# Patient Record
Sex: Female | Born: 1940
Health system: Southern US, Community
[De-identification: ages and names within clinical notes are randomized; demographics above are authoritative.]

## PROBLEM LIST (undated history)

## (undated) DIAGNOSIS — E039 Hypothyroidism, unspecified: Secondary | ICD-10-CM

## (undated) DIAGNOSIS — I10 Essential (primary) hypertension: Secondary | ICD-10-CM

## (undated) DIAGNOSIS — E785 Hyperlipidemia, unspecified: Secondary | ICD-10-CM

## (undated) DIAGNOSIS — Z8542 Personal history of malignant neoplasm of other parts of uterus: Secondary | ICD-10-CM

## (undated) DIAGNOSIS — M1990 Unspecified osteoarthritis, unspecified site: Secondary | ICD-10-CM

## (undated) DIAGNOSIS — B019 Varicella without complication: Secondary | ICD-10-CM

## (undated) DIAGNOSIS — M8580 Other specified disorders of bone density and structure, unspecified site: Secondary | ICD-10-CM

## (undated) HISTORY — DX: Personal history of malignant neoplasm of other parts of uterus: Z85.42

## (undated) HISTORY — PX: POLYPECTOMY: SHX149

## (undated) HISTORY — PX: HERNIA REPAIR: SHX51

## (undated) HISTORY — DX: Other specified disorders of bone density and structure, unspecified site: M85.80

## (undated) HISTORY — DX: Hypothyroidism, unspecified: E03.9

## (undated) HISTORY — DX: Varicella without complication: B01.9

## (undated) HISTORY — DX: Essential (primary) hypertension: I10

## (undated) HISTORY — PX: COLONOSCOPY: SHX174

## (undated) HISTORY — DX: Hyperlipidemia, unspecified: E78.5

## (undated) HISTORY — DX: Unspecified osteoarthritis, unspecified site: M19.90

## (undated) HISTORY — PX: FOOT SURGERY: SHX648

---

## 2000-10-18 HISTORY — PX: ABDOMINAL HYSTERECTOMY: SHX81

## 2016-11-25 DIAGNOSIS — I159 Secondary hypertension, unspecified: Secondary | ICD-10-CM | POA: Diagnosis not present

## 2016-11-25 DIAGNOSIS — E785 Hyperlipidemia, unspecified: Secondary | ICD-10-CM | POA: Diagnosis not present

## 2016-11-25 DIAGNOSIS — E039 Hypothyroidism, unspecified: Secondary | ICD-10-CM | POA: Diagnosis not present

## 2016-11-25 DIAGNOSIS — R5381 Other malaise: Secondary | ICD-10-CM | POA: Diagnosis not present

## 2016-11-25 DIAGNOSIS — R5383 Other fatigue: Secondary | ICD-10-CM | POA: Diagnosis not present

## 2017-02-11 DIAGNOSIS — I7389 Other specified peripheral vascular diseases: Secondary | ICD-10-CM | POA: Diagnosis not present

## 2017-02-11 DIAGNOSIS — B351 Tinea unguium: Secondary | ICD-10-CM | POA: Diagnosis not present

## 2017-02-11 DIAGNOSIS — L851 Acquired keratosis [keratoderma] palmaris et plantaris: Secondary | ICD-10-CM | POA: Diagnosis not present

## 2017-05-25 DIAGNOSIS — E039 Hypothyroidism, unspecified: Secondary | ICD-10-CM | POA: Diagnosis not present

## 2017-05-25 DIAGNOSIS — R7303 Prediabetes: Secondary | ICD-10-CM | POA: Diagnosis not present

## 2017-06-28 DIAGNOSIS — Z23 Encounter for immunization: Secondary | ICD-10-CM | POA: Diagnosis not present

## 2017-10-26 DIAGNOSIS — R600 Localized edema: Secondary | ICD-10-CM | POA: Diagnosis not present

## 2017-10-26 DIAGNOSIS — M79674 Pain in right toe(s): Secondary | ICD-10-CM | POA: Diagnosis not present

## 2017-10-26 DIAGNOSIS — I7091 Generalized atherosclerosis: Secondary | ICD-10-CM | POA: Diagnosis not present

## 2017-10-26 DIAGNOSIS — L84 Corns and callosities: Secondary | ICD-10-CM | POA: Diagnosis not present

## 2017-10-26 DIAGNOSIS — M79675 Pain in left toe(s): Secondary | ICD-10-CM | POA: Diagnosis not present

## 2017-10-26 DIAGNOSIS — B351 Tinea unguium: Secondary | ICD-10-CM | POA: Diagnosis not present

## 2017-11-28 DIAGNOSIS — R7303 Prediabetes: Secondary | ICD-10-CM | POA: Diagnosis not present

## 2017-11-28 DIAGNOSIS — G4700 Insomnia, unspecified: Secondary | ICD-10-CM | POA: Diagnosis not present

## 2017-11-28 DIAGNOSIS — E039 Hypothyroidism, unspecified: Secondary | ICD-10-CM | POA: Diagnosis not present

## 2017-11-28 DIAGNOSIS — E785 Hyperlipidemia, unspecified: Secondary | ICD-10-CM | POA: Diagnosis not present

## 2017-11-28 DIAGNOSIS — I10 Essential (primary) hypertension: Secondary | ICD-10-CM | POA: Diagnosis not present

## 2017-12-28 DIAGNOSIS — H43813 Vitreous degeneration, bilateral: Secondary | ICD-10-CM | POA: Diagnosis not present

## 2017-12-28 DIAGNOSIS — H2513 Age-related nuclear cataract, bilateral: Secondary | ICD-10-CM | POA: Diagnosis not present

## 2017-12-28 DIAGNOSIS — H524 Presbyopia: Secondary | ICD-10-CM | POA: Diagnosis not present

## 2017-12-28 DIAGNOSIS — I7091 Generalized atherosclerosis: Secondary | ICD-10-CM | POA: Diagnosis not present

## 2017-12-28 DIAGNOSIS — M79675 Pain in left toe(s): Secondary | ICD-10-CM | POA: Diagnosis not present

## 2017-12-28 DIAGNOSIS — B351 Tinea unguium: Secondary | ICD-10-CM | POA: Diagnosis not present

## 2017-12-28 DIAGNOSIS — M79674 Pain in right toe(s): Secondary | ICD-10-CM | POA: Diagnosis not present

## 2017-12-28 DIAGNOSIS — L84 Corns and callosities: Secondary | ICD-10-CM | POA: Diagnosis not present

## 2018-01-30 DIAGNOSIS — B9789 Other viral agents as the cause of diseases classified elsewhere: Secondary | ICD-10-CM | POA: Diagnosis not present

## 2018-01-30 DIAGNOSIS — J069 Acute upper respiratory infection, unspecified: Secondary | ICD-10-CM | POA: Diagnosis not present

## 2018-01-30 DIAGNOSIS — H1033 Unspecified acute conjunctivitis, bilateral: Secondary | ICD-10-CM | POA: Diagnosis not present

## 2018-02-14 DIAGNOSIS — L309 Dermatitis, unspecified: Secondary | ICD-10-CM | POA: Diagnosis not present

## 2018-02-14 DIAGNOSIS — L821 Other seborrheic keratosis: Secondary | ICD-10-CM | POA: Diagnosis not present

## 2018-02-14 DIAGNOSIS — D1800 Hemangioma unspecified site: Secondary | ICD-10-CM | POA: Diagnosis not present

## 2018-02-16 DIAGNOSIS — Q181 Preauricular sinus and cyst: Secondary | ICD-10-CM | POA: Diagnosis not present

## 2018-02-16 DIAGNOSIS — R300 Dysuria: Secondary | ICD-10-CM | POA: Diagnosis not present

## 2018-02-16 DIAGNOSIS — N907 Vulvar cyst: Secondary | ICD-10-CM | POA: Diagnosis not present

## 2018-02-23 DIAGNOSIS — N9089 Other specified noninflammatory disorders of vulva and perineum: Secondary | ICD-10-CM | POA: Diagnosis not present

## 2018-03-01 DIAGNOSIS — L84 Corns and callosities: Secondary | ICD-10-CM | POA: Diagnosis not present

## 2018-03-01 DIAGNOSIS — B351 Tinea unguium: Secondary | ICD-10-CM | POA: Diagnosis not present

## 2018-03-01 DIAGNOSIS — M79675 Pain in left toe(s): Secondary | ICD-10-CM | POA: Diagnosis not present

## 2018-03-01 DIAGNOSIS — M79674 Pain in right toe(s): Secondary | ICD-10-CM | POA: Diagnosis not present

## 2018-03-01 DIAGNOSIS — I7091 Generalized atherosclerosis: Secondary | ICD-10-CM | POA: Diagnosis not present

## 2018-03-22 DIAGNOSIS — I10 Essential (primary) hypertension: Secondary | ICD-10-CM | POA: Diagnosis not present

## 2018-03-22 DIAGNOSIS — E785 Hyperlipidemia, unspecified: Secondary | ICD-10-CM | POA: Diagnosis not present

## 2018-03-22 DIAGNOSIS — R5383 Other fatigue: Secondary | ICD-10-CM | POA: Diagnosis not present

## 2018-03-22 DIAGNOSIS — R7303 Prediabetes: Secondary | ICD-10-CM | POA: Diagnosis not present

## 2018-04-04 DIAGNOSIS — L720 Epidermal cyst: Secondary | ICD-10-CM | POA: Diagnosis not present

## 2018-04-26 DIAGNOSIS — B351 Tinea unguium: Secondary | ICD-10-CM | POA: Diagnosis not present

## 2018-04-26 DIAGNOSIS — L84 Corns and callosities: Secondary | ICD-10-CM | POA: Diagnosis not present

## 2018-04-26 DIAGNOSIS — M79674 Pain in right toe(s): Secondary | ICD-10-CM | POA: Diagnosis not present

## 2018-04-26 DIAGNOSIS — M79675 Pain in left toe(s): Secondary | ICD-10-CM | POA: Diagnosis not present

## 2018-04-26 DIAGNOSIS — I7091 Generalized atherosclerosis: Secondary | ICD-10-CM | POA: Diagnosis not present

## 2018-05-18 ENCOUNTER — Telehealth: Payer: Self-pay

## 2018-05-18 NOTE — Telephone Encounter (Signed)
Copied from [place] [id number]. Topic: Quick Communication - See Telephone Encounter >> May 18, 2018 12:31 PM [name] wrote: Pt called asking that Jacqueline Smith send to her a copy of the paperwork she was given today when the patient came in to see Dr. Nani Ravens today  Best number [phone]

## 2018-05-23 ENCOUNTER — Telehealth: Payer: Self-pay | Admitting: *Deleted

## 2018-05-23 NOTE — Telephone Encounter (Signed)
Received Medical records from [facility] [MRO], New patient 08/09/18; forwarded to provider/SLS 08/06

## 2018-06-05 ENCOUNTER — Telehealth: Payer: Self-pay | Admitting: Family Medicine

## 2018-06-05 ENCOUNTER — Other Ambulatory Visit: Payer: Self-pay | Admitting: Family Medicine

## 2018-06-05 DIAGNOSIS — Z1231 Encounter for screening mammogram for malignant neoplasm of breast: Secondary | ICD-10-CM

## 2018-06-05 NOTE — Telephone Encounter (Signed)
Spoke to patient regarding Mammogram. States she will call to schedule. Advised they will send order for provider to sign.

## 2018-06-05 NOTE — Telephone Encounter (Signed)
Copied from [place] [id number]. Topic: General - Other >> Jun 05, 2018  9:56 AM Judyann Munson wrote: Reason for CRM: Patient is requesting a call back in regards to some questions she has for scheduling a mammogram. Best contact number [phone]. Please advise

## 2018-06-20 DIAGNOSIS — Z23 Encounter for immunization: Secondary | ICD-10-CM | POA: Diagnosis not present

## 2018-06-23 DIAGNOSIS — L84 Corns and callosities: Secondary | ICD-10-CM | POA: Diagnosis not present

## 2018-06-23 DIAGNOSIS — B351 Tinea unguium: Secondary | ICD-10-CM | POA: Diagnosis not present

## 2018-06-23 DIAGNOSIS — M79671 Pain in right foot: Secondary | ICD-10-CM | POA: Diagnosis not present

## 2018-06-23 DIAGNOSIS — M79672 Pain in left foot: Secondary | ICD-10-CM | POA: Diagnosis not present

## 2018-07-05 ENCOUNTER — Ambulatory Visit: Payer: Self-pay

## 2018-07-05 ENCOUNTER — Ambulatory Visit
Admission: RE | Admit: 2018-07-05 | Discharge: 2018-07-05 | Disposition: A | Discharge status: Home / Self Care | Payer: Medicare Other | Source: Ambulatory Visit | Attending: Family Medicine | Admitting: Family Medicine

## 2018-07-05 DIAGNOSIS — Z1231 Encounter for screening mammogram for malignant neoplasm of breast: Secondary | ICD-10-CM

## 2018-08-09 ENCOUNTER — Ambulatory Visit: Payer: Self-pay | Admitting: Family Medicine

## 2018-08-11 ENCOUNTER — Ambulatory Visit: Payer: Self-pay | Admitting: Podiatry

## 2018-08-14 ENCOUNTER — Encounter: Payer: Self-pay | Admitting: Family Medicine

## 2018-08-14 ENCOUNTER — Ambulatory Visit (INDEPENDENT_AMBULATORY_CARE_PROVIDER_SITE_OTHER): Payer: Medicare Other | Admitting: Family Medicine

## 2018-08-14 VITALS — BP 109/60 | HR 95 | Temp 98.5°F | Resp 16 | Ht 65.0 in | Wt 180.0 lb

## 2018-08-14 DIAGNOSIS — M069 Rheumatoid arthritis, unspecified: Secondary | ICD-10-CM | POA: Insufficient documentation

## 2018-08-14 DIAGNOSIS — I10 Essential (primary) hypertension: Secondary | ICD-10-CM | POA: Insufficient documentation

## 2018-08-14 DIAGNOSIS — E785 Hyperlipidemia, unspecified: Secondary | ICD-10-CM | POA: Insufficient documentation

## 2018-08-14 DIAGNOSIS — F5102 Adjustment insomnia: Secondary | ICD-10-CM | POA: Diagnosis not present

## 2018-08-14 DIAGNOSIS — L989 Disorder of the skin and subcutaneous tissue, unspecified: Secondary | ICD-10-CM

## 2018-08-14 DIAGNOSIS — L570 Actinic keratosis: Secondary | ICD-10-CM

## 2018-08-14 DIAGNOSIS — E039 Hypothyroidism, unspecified: Secondary | ICD-10-CM | POA: Insufficient documentation

## 2018-08-14 DIAGNOSIS — R7303 Prediabetes: Secondary | ICD-10-CM

## 2018-08-14 DIAGNOSIS — D489 Neoplasm of uncertain behavior, unspecified: Secondary | ICD-10-CM

## 2018-08-14 DIAGNOSIS — G4700 Insomnia, unspecified: Secondary | ICD-10-CM | POA: Insufficient documentation

## 2018-08-14 MED ORDER — TEMAZEPAM 15 MG PO CAPS: ORAL_CAPSULE | ORAL | 3 refills | Status: DC

## 2018-08-14 NOTE — Progress Notes (Signed)
Chief Complaint  Patient presents with  . New Patient (Initial Visit)       New Patient Visit SUBJECTIVE: HPI: Jacqueline Smith is an 77 y.o.female who is being seen for establishing care.  The patient was previously seen at clinic in [place].  Hypertension Patient presents for hypertension follow up. She is compliant with medications- Hyzaar 100-25 mg/d. Patient has these side effects of medication: none She is adhering to a healthy diet overall.  Hypothyroidism Patient presents for follow-up of hypothyroidism.  Reports compliance with medication. Current symptoms include: denies fatigue, weight changes, heat/cold intolerance, bowel/skin changes or CVS symptoms She believes her dose should be unchanged  Skin lesion Area on nose that is scaly and has been bothering for last 12 mo or so. Will scale, sometimes itch, sometimes go away, but always come back. She is anxious about skin cancer.  Prediabetes Hx of prediabetes, last labs done in Feb of this year.  Insomnia Hx of insomnia 2/2 stress from her husband's health. He has CAD, CLL, Lymphoma, and frequent bouts of skin cancer. She will intermittently use Restoril. No AE's from it.   RA Hx of RA. Takes glucosamine that does help. Sometimes will take Tylenol that also helps. Does not want to see a rheumatologist at this time. jts affected area mainly knees, but now starting to affect hands. Not on any rx meds specifically for this.  Allergies  Allergen Reactions  . Demerol [Meperidine Hcl]   . Iodine   . Keflex [Cephalexin]   . Macrobid [Nitrofurantoin Macrocrystal]   . Mevacor [Lovastatin]   . Morphine And Related   . Sulfa Antibiotics   . Terramycin [Oxytetracycline]   . Vicodin [Hydrocodone-Acetaminophen]     Past Medical History:  Diagnosis Date  . Arthritis   . Chickenpox   . Hyperlipidemia   . Hypertension   . Hypothyroid    Past Surgical History:  Procedure Laterality Date  . ABDOMINAL HYSTERECTOMY  2002   . FOOT SURGERY Right    Family History  Problem Relation Age of Onset  . Arthritis Mother   . Hyperlipidemia Mother   . Heart attack Mother   . Hypertension Mother   . Arthritis Father   . Early death Father   . Hypertension Father   . Kidney disease Father   . Arthritis Sister   . Heart attack Maternal Grandmother   . Diabetes Maternal Grandfather   . Stroke Paternal Grandmother   . Arthritis Sister   . Breast cancer Neg Hx    Allergies  Allergen Reactions  . Demerol [Meperidine Hcl]   . Iodine   . Keflex [Cephalexin]   . Macrobid [Nitrofurantoin Macrocrystal]   . Mevacor [Lovastatin]   . Morphine And Related   . Sulfa Antibiotics   . Terramycin [Oxytetracycline]   . Vicodin [Hydrocodone-Acetaminophen]     Current Outpatient Medications:  .  cyanocobalamin 1000 MCG tablet, Take by mouth daily., Disp: , Rfl:  .  glucosamine-chondroitin 500-400 MG tablet, Take 1 tablet by mouth 3 (three) times daily., Disp: , Rfl:  .  levothyroxine (SYNTHROID, LEVOTHROID) 112 MCG tablet, Take 112 mcg by mouth daily before breakfast., Disp: , Rfl:  .  triamcinolone cream (KENALOG) 0.1 %, Apply 1 application topically 2 (two) times daily., Disp: , Rfl:  .  losartan-hydrochlorothiazide (HYZAAR) 100-25 MG tablet, Take 1 tablet by mouth daily., Disp: , Rfl: 0 .  pravastatin (PRAVACHOL) 10 MG tablet, TAKE 1 TABLET BY MOUTH EVERYDAY AT BEDTIME, Disp: , Rfl: 1 .  temazepam (RESTORIL) 15 MG capsule, TAKE 1 CAPSULE BY MOUTH AT BEDTIME AS NEEDED FOR SLEEP., Disp: 30 capsule, Rfl: 3  ROS 10 pt ROS neg unless otherwise noted in HPI  OBJECTIVE: BP 109/60 (BP Location: Left Arm, Patient Position: Sitting, Cuff Size: Small)   Pulse 95   Temp 98.5 F (36.9 C) (Oral)   Resp 16   Ht 5\' 5"  (1.651 m)   Wt 180 lb (81.6 kg)   SpO2 98%   BMI 29.95 kg/m   Constitutional: -  VS reviewed -  Well developed, well nourished, appears stated age -  No apparent distress  Psychiatric: -  Oriented to person,  place, and time -  Memory intact -  Affect and mood normal -  Fluent conversation, good eye contact -  Judgment and insight age appropriate  Eye: -  Conjunctivae clear, no discharge -  Pupils symmetric, round, reactive to light  ENMT: -  MMM    Pharynx moist, no exudate, no erythema  Neck: -  No gross swelling, no palpable masses -  Thyroid midline, not enlarged, mobile, no palpable masses  Cardiovascular: -  RRR -  No LE edema  Respiratory: -  Normal respiratory effort, no accessory muscle use, no retraction -  Breath sounds equal, no wheezes, no ronchi, no crackles  Neurological:  -  CN II - XII grossly intact -  Sensation grossly intact to light touch, equal bilaterally  Musculoskeletal: -  No clubbing, no cyanosis -  Gait normal  Skin: -  Scaly patch on bridge of nose measuring approx 0.8 cm in diameter. No erythema, fluctuance, ttp.  -  Warm and dry to palpation   Procedure note: cryotherapy Verbal consent obtained 1 skin lesion treated on nose Liquid nitrogen was applied via a thin spray creating an ice ball with 1-2 mm corona surrounding the lesion The patient tolerated the procedure well There were no immediate complications noted   ASSESSMENT/PLAN: Essential hypertension - Plan: losartan-hydrochlorothiazide (HYZAAR) 100-25 MG tablet, Comprehensive metabolic panel  Rheumatoid arthritis involving both knees, unspecified rheumatoid factor presence ([place]) - Plan: glucosamine-chondroitin 500-400 MG tablet  Neoplasm of uncertain behavior  Hyperlipidemia, unspecified hyperlipidemia type - Plan: pravastatin (PRAVACHOL) 10 MG tablet, Lipid panel  Prediabetes - Plan: Hemoglobin A1c  Hypothyroidism, unspecified type - Plan: levothyroxine (SYNTHROID, LEVOTHROID) 112 MCG tablet, TSH, T4, free  Adjustment insomnia - Plan: temazepam (RESTORIL) 15 MG capsule  Patient has already gotten [place] her records. OK to refill Restoril given intermittent use and situational use with poor  health of spouse. Cryotherapy for suspected actinic keratosis. Check labs. Continue medication. Patient should return in 6 mo or prn. Sooner for injections if needed. The patient voiced understanding and agreement to the plan.   [place], DO 08/14/18  4:25 PM

## 2018-08-14 NOTE — Patient Instructions (Addendum)
Let me know if you are ever interested in knee injections to help with pain.   Ice/cold pack over area for 10-15 min twice daily.  Give [place] 2-3 business days to get the results of your labs back.   Let us know if you need anything.

## 2018-08-15 ENCOUNTER — Telehealth: Payer: Self-pay | Admitting: Family Medicine

## 2018-08-15 DIAGNOSIS — L989 Disorder of the skin and subcutaneous tissue, unspecified: Secondary | ICD-10-CM

## 2018-08-15 LAB — COMPREHENSIVE METABOLIC PANEL
ALK PHOS: 45 U/L (ref 39–117)
ALT: 13 U/L (ref 0–35)
AST: 18 U/L (ref 0–37)
Albumin: 4.4 g/dL (ref 3.5–5.2)
BUN: 13 mg/dL (ref 6–23)
CALCIUM: 9.6 mg/dL (ref 8.4–10.5)
CO2: 28 meq/L (ref 19–32)
Chloride: 104 mEq/L (ref 96–112)
Creatinine, Ser: 0.63 mg/dL (ref 0.40–1.20)
GFR: 97.1 mL/min (ref >60.00)
GLUCOSE: 98 mg/dL (ref 70–99)
POTASSIUM: 4.2 meq/L (ref 3.5–5.1)
Sodium: 141 mEq/L (ref 135–145)
TOTAL PROTEIN: 6.8 g/dL (ref 6.0–8.3)
Total Bilirubin: 0.8 mg/dL (ref 0.2–1.2)

## 2018-08-15 LAB — TSH: TSH: 0.22 u[IU]/mL — ABNORMAL LOW (ref 0.35–4.50)

## 2018-08-15 LAB — LIPID PANEL
CHOLESTEROL: 178 mg/dL (ref 0–200)
HDL: 79.8 mg/dL (ref >39.00)
LDL Cholesterol: 85 mg/dL (ref 0–99)
NonHDL: 98.54
TRIGLYCERIDES: 69 mg/dL (ref 0.0–149.0)
Total CHOL/HDL Ratio: 2
VLDL: 13.8 mg/dL (ref 0.0–40.0)

## 2018-08-15 LAB — HEMOGLOBIN A1C: Hgb A1c MFr Bld: 5.5 % (ref 4.6–6.5)

## 2018-08-15 LAB — T4, FREE: Free T4: 1.26 ng/dL (ref 0.60–1.60)

## 2018-08-15 NOTE — Telephone Encounter (Signed)
Copied from [place] [id number]. Topic: Quick Communication - See Telephone Encounter >> Aug 15, 2018  5:17 PM Percell Belt A wrote: CRM for notification. See Telephone encounter for: 08/15/18.   Pt called in and has some concerns about the place on her nose,  she stated it is not scabbing over and would like to talk to nurse about it and also noticed that when she left there BP was 109/60 on the AVS  she was concerned that was to low for her.  Please advise   Best number [phone]

## 2018-08-16 NOTE — Telephone Encounter (Signed)
Have addressed issues with the patient/PCP this am

## 2018-08-17 NOTE — Telephone Encounter (Signed)
Patient called again stating no one has addressed the issues of her low BP.  Patient is on BP medication and she is afraid this is taking her BP down too low. Please advise. Patient would like to talk to the provider himself.

## 2018-08-18 ENCOUNTER — Telehealth: Payer: Self-pay

## 2018-08-18 NOTE — Telephone Encounter (Signed)
Called left message to call back 

## 2018-08-18 NOTE — Telephone Encounter (Signed)
OK to place referral. TY.

## 2018-08-18 NOTE — Telephone Encounter (Signed)
Also she would like a derm referral for her husband.

## 2018-08-18 NOTE — Telephone Encounter (Signed)
Pt. Called, upset that she did not have her recent labs mailed to her. "My old practice in [place] always mailed them out". Author confirmed that recents labs were normal, and that Robin, CMA, had discussed this over the phone with her already. Pt. Stated she would still like results mailed to her. Recent labs printed off, along with husband's recent lab results per pt. Request, and mailed to home address. Routed to [place] as [place].

## 2018-08-18 NOTE — Telephone Encounter (Signed)
BP is in normal range, the bottom number gets lower as we age due to changes. If concerned further, we can discuss in appt. TY.

## 2018-08-18 NOTE — Addendum Note (Signed)
Addended by: Sharon Seller B on: 08/18/2018 03:44 PM   Modules accepted: Orders

## 2018-08-18 NOTE — Telephone Encounter (Signed)
Patient is requesting a call back from nurse to find out if she should still take her BP medication because her BP have been running low in normal range. Please advise Ph# [phone]

## 2018-08-18 NOTE — Telephone Encounter (Signed)
Referral done

## 2018-08-18 NOTE — Telephone Encounter (Signed)
Ok thanks for the information.

## 2018-08-18 NOTE — Telephone Encounter (Signed)
Called the patient and number was busy

## 2018-08-18 NOTE — Telephone Encounter (Signed)
The patient called back and informed of PCP instructions. She verbalized understanding.  The patient would like a referral to [facility] dermatology (Dr. Alexis Goodell. Ronnald Ramp). This is for the spot on her nose.

## 2018-08-28 DIAGNOSIS — M79671 Pain in right foot: Secondary | ICD-10-CM | POA: Diagnosis not present

## 2018-08-28 DIAGNOSIS — M79672 Pain in left foot: Secondary | ICD-10-CM | POA: Diagnosis not present

## 2018-08-28 DIAGNOSIS — B351 Tinea unguium: Secondary | ICD-10-CM | POA: Diagnosis not present

## 2018-09-13 ENCOUNTER — Other Ambulatory Visit: Payer: Self-pay | Admitting: Family Medicine

## 2018-09-13 MED ORDER — TRIAMCINOLONE ACETONIDE 0.1 % EX CREA: 1.0000 "application " | TOPICAL_CREAM | Freq: Two times a day (BID) | CUTANEOUS | 0 refills | Status: DC

## 2018-09-13 NOTE — Telephone Encounter (Signed)
Copied from [place] [id number]. Topic: Quick Communication - Rx Refill/Question >> Sep 13, 2018 11:47 AM Bea Graff, NT wrote: Medication: triamcinolone cream (KENALOG) 0.1 %   Has the patient contacted their pharmacy? Yes.   (Agent: If no, request that the patient contact the pharmacy for the refill.) (Agent: If yes, when and what did the pharmacy advise?)  Preferred Pharmacy (with phone number or street name): [id number] [place], [address]. AT [address] [phone] (Phone) [phone] (Fax)    Agent: Please be advised that RX refills may take up to 3 business days. We ask that you follow-up with your pharmacy.

## 2018-09-13 NOTE — Telephone Encounter (Signed)
Requested medication (s) are due for refill today: yes  Requested medication (s) are on the active medication list: yes    Last refill: 08/14/18  Future visit scheduled no  Notes to clinic:historical provider  Requested Prescriptions  Pending Prescriptions Disp Refills   triamcinolone cream (KENALOG) 0.1 % 30 g     Sig: Apply 1 application topically 2 (two) times daily.     Dermatology:  Corticosteroids Passed - 09/13/2018 11:51 AM      Passed - Valid encounter within last 12 months    Recent Outpatient Visits          1 month ago Essential hypertension   [facility], [place], [place]

## 2018-09-22 ENCOUNTER — Other Ambulatory Visit: Payer: Self-pay | Admitting: Family Medicine

## 2018-09-22 DIAGNOSIS — L570 Actinic keratosis: Secondary | ICD-10-CM

## 2018-09-22 NOTE — Telephone Encounter (Signed)
Pt. Requesting refill of kenalog cream, last refilled 11/27. Routed to Dr. Nani Ravens to review and approve.

## 2018-10-30 DIAGNOSIS — M79671 Pain in right foot: Secondary | ICD-10-CM | POA: Diagnosis not present

## 2018-10-30 DIAGNOSIS — B351 Tinea unguium: Secondary | ICD-10-CM | POA: Diagnosis not present

## 2018-10-30 DIAGNOSIS — M79672 Pain in left foot: Secondary | ICD-10-CM | POA: Diagnosis not present

## 2018-11-09 ENCOUNTER — Encounter (INDEPENDENT_AMBULATORY_CARE_PROVIDER_SITE_OTHER): Payer: Self-pay

## 2018-11-09 ENCOUNTER — Ambulatory Visit (INDEPENDENT_AMBULATORY_CARE_PROVIDER_SITE_OTHER): Payer: Medicare Other | Admitting: Family Medicine

## 2018-11-09 ENCOUNTER — Encounter: Payer: Self-pay | Admitting: Family Medicine

## 2018-11-09 VITALS — BP 114/72 | HR 78 | Temp 97.8°F | Ht 65.0 in | Wt 184.4 lb

## 2018-11-09 DIAGNOSIS — L309 Dermatitis, unspecified: Secondary | ICD-10-CM

## 2018-11-09 DIAGNOSIS — M7121 Synovial cyst of popliteal space [Baker], right knee: Secondary | ICD-10-CM | POA: Diagnosis not present

## 2018-11-09 DIAGNOSIS — I83812 Varicose veins of left lower extremities with pain: Secondary | ICD-10-CM

## 2018-11-09 DIAGNOSIS — L989 Disorder of the skin and subcutaneous tissue, unspecified: Secondary | ICD-10-CM

## 2018-11-09 MED ORDER — SYNTHROID 125 MCG PO TABS: 125.0000 ug | ORAL_TABLET | Freq: Every day | ORAL | 3 refills | Status: DC

## 2018-11-09 NOTE — Progress Notes (Signed)
Pre visit review using our clinic review tool, if applicable. No additional management support is needed unless otherwise documented below in the visit note. 

## 2018-11-09 NOTE — Patient Instructions (Addendum)
If you do not hear anything about your referral in the next 1-2 weeks, call our office and ask for an update.  The skin lesion on your face looks OK. If anything changes, let me know.  Try Vaseline on your hands instead of the triamcinolone cream.   Let us know if you need anything.

## 2018-11-09 NOTE — Progress Notes (Signed)
Chief Complaint  Patient presents with  . Cyst    right leg behind knee    Subjective: Patient is a 78 y.o. female here for cyst behind R knee.  Starting to bother her. Has been there for several years.  She was told was a Baker's cyst in the past and because she was not having symptoms, nothing was done.  It has come back over the past couple months.  She has a skin lesion on her face.  She has an appointment with a dermatologist in April.  No significant change.  It does not hurt or itch.  Patient has a history of eczema on her hands.  She will use Kenalog in her hands afterwards.  She has never tried Vaseline before.  She has a history of varicose veins.  They only affect her left leg.  She is looking for a TED hose but has been unsuccessful over-the-counter.  She saw the vascular team in the past and they did a procedure she apparently did not tolerate.   ROS: MSK: As noted in HPI Cardio: No CP Lungs: No SOB  Past Medical History:  Diagnosis Date  . Arthritis   . Chickenpox   . Hyperlipidemia   . Hypertension   . Hypothyroid     Objective: BP 114/72 (BP Location: Left Arm, Patient Position: Sitting, Cuff Size: Normal)   Pulse 78   Temp 97.8 F (36.6 C) (Oral)   Ht 5\' 5"  (1.651 m)   Wt 184 lb 6 oz (83.6 kg)   SpO2 97%   BMI 30.68 kg/m  General: Awake, appears stated age HEENT: MMM, EOMi Heart: RRR, no bruits or LE edema Skin: +varicose veins on LLE Lungs: CTAB, no rales, wheezes or rhonchi. No accessory muscle use MSK: There is a soft bulge in popliteal area on R, no ttp or excessive warmth Psych: Age appropriate judgment and insight, normal affect and mood  Assessment and Plan: Synovial cyst of right popliteal space - Plan: Ambulatory referral to Orthopedic Surgery  Skin lesion  Eczema, unspecified type  Varicose veins of left lower extremity with pain  #1-Refer to Ortho #2-monitor, if it changes I will remove it prior to her dermatology  appointment #3-try Vaseline as it has fewer side effects #4- rx for ted hose given.  Will hold off on vascular surgery referral. Follow-up as originally scheduled in April where we will perform her EKG. The patient voiced understanding and agreement to the plan.  [place], DO 11/09/18  1:20 PM

## 2018-11-17 ENCOUNTER — Telehealth: Payer: Self-pay | Admitting: Family Medicine

## 2018-11-17 MED ORDER — SYNTHROID 112 MCG PO TABS: 112.0000 ug | ORAL_TABLET | Freq: Every day | ORAL | 3 refills | Status: DC

## 2018-11-17 NOTE — Telephone Encounter (Signed)
Advise please.

## 2018-11-17 NOTE — Telephone Encounter (Signed)
Patient is returning call because she missed the call. The best contact number [phone].

## 2018-11-17 NOTE — Telephone Encounter (Signed)
Printed. She can pick up or give [place] pharmacy info to fax. To my knowledge we have not received anything from a [place]. TY.

## 2018-11-17 NOTE — Telephone Encounter (Signed)
Copied from [place] [id number]. Topic: Quick Communication - Rx Refill/Question >> Nov 17, 2018 10:35 AM Reyne Dumas L wrote: Medication:  SYNTHROID 125 MCG tablet  Pt states that this script was written wrong.  States that she has always had 169mcg tablets, not 127mcg.  Pt states that she gets her medication from [place] and now there are lots of questions about what she needs. Pt states that she is running out of medication.  Pt can be reached at [phone]

## 2018-11-17 NOTE — Telephone Encounter (Signed)
Called her back and will mail the copy to her.

## 2018-11-17 NOTE — Telephone Encounter (Signed)
Actually did received request from [place]/have now faxed hardcopy to them and did receive confirmation. Will mail a hardcopy to the patient for her records. Left detailed message Fax number is [phone]

## 2018-11-17 NOTE — Telephone Encounter (Signed)
Called the patient and no answer//no voice mail to leave a message

## 2018-11-17 NOTE — Telephone Encounter (Signed)
Pt called back because she states the synthroid Rx should be  SYNTHROID 112 MCG tablet. Pt states she gets this rx from [place]. She could not give me the name of the pharmacy. I asked her how should we get the Rx to the pharmacy, as [facility]. She could not understand what I was asking. She states she had a hard copy that she faxed last time and did not look at the rx.  Pt states the pharmacy is supposed to contact our office. The [place] pharmacy is not on her list. Pt hung up.

## 2018-11-29 ENCOUNTER — Ambulatory Visit (INDEPENDENT_AMBULATORY_CARE_PROVIDER_SITE_OTHER): Payer: Medicare Other

## 2018-11-29 ENCOUNTER — Ambulatory Visit (INDEPENDENT_AMBULATORY_CARE_PROVIDER_SITE_OTHER): Payer: Medicare Other | Admitting: Orthopedic Surgery

## 2018-11-29 ENCOUNTER — Encounter (INDEPENDENT_AMBULATORY_CARE_PROVIDER_SITE_OTHER): Payer: Self-pay | Admitting: Orthopedic Surgery

## 2018-11-29 DIAGNOSIS — M25561 Pain in right knee: Secondary | ICD-10-CM

## 2018-11-29 DIAGNOSIS — G8929 Other chronic pain: Secondary | ICD-10-CM

## 2018-12-02 ENCOUNTER — Encounter (INDEPENDENT_AMBULATORY_CARE_PROVIDER_SITE_OTHER): Payer: Self-pay | Admitting: Orthopedic Surgery

## 2018-12-02 NOTE — Progress Notes (Signed)
Office Visit Note   Patient: Jacqueline Smith           Date of Birth: 1941-09-12           MRN: [id number] Visit Date: 11/29/2018 Requested by: Shelda Pal, [address] [place], [place] [address] PCP: Shelda Pal, DO  Subjective: Chief Complaint  Patient presents with  . Right Knee - Pain    HPI: Cayleigh is a patient with right knee pain.  Normally she has no pain but she has been having some fullness in the posterior aspect of the knee and recently.  Denies any history of injury.  Denies any mechanical symptoms in the knee.  Occasionally takes some over-the-counter medication for the problem.              ROS: All systems reviewed are negative as they relate to the chief complaint within the history of present illness.  Patient denies  fevers or chills.   Assessment & Plan: Visit Diagnoses:  1. Chronic pain of right knee     Plan: Impression is right knee arthritis with sizable Baker's cyst in the posterior aspect of the knee.  Patient would like to have that cyst decompressed today.  She does not have a driver and I think it would be best if she comes back next week with a driver so I can numb up the posterior aspect of that knee and aspirate that cyst under ultrasound guidance.  It is her right leg.  I will see her back next week and we may also inject the knee at that time for some arthritis pain relief as well.  Follow-Up Instructions: No follow-ups on file.   Orders:  Orders Placed This Encounter  Procedures  . XR Knee 1-2 Views Right   No orders of the defined types were placed in this encounter.     Procedures: No procedures performed   Clinical Data: No additional findings.  Objective: Vital Signs: There were no vitals taken for this visit.  Physical Exam:   Constitutional: Patient appears well-developed HEENT:  Head: Normocephalic Eyes:EOM are normal Neck: Normal range of motion Cardiovascular: Normal  rate Pulmonary/chest: Effort normal Neurologic: Patient is alert Skin: Skin is warm Psychiatric: Patient has normal mood and affect    Ortho Exam: Orthopedic exam demonstrates normal gait alignment with palpable pedal pulses.  No groin pain with internal X rotation of either leg.  Trace effusion present in the right knee no effusion in the left knee.  Baker's cyst is palpable in the posterior aspect of the right knee.  Ultrasound confirms cystic nature of the Baker's cyst.  No solid component no pulsatile component to this cyst.  Range of motion easily past 90 with stable collateral cruciate ligaments  Specialty Comments:  No specialty comments available.  Imaging: No results found.   PMFS History: Patient Active Problem List   Diagnosis Date Noted  . Skin lesion 11/09/2018  . Eczema 11/09/2018  . Essential hypertension 08/14/2018  . Rheumatoid arthritis involving both knees ([place]) 08/14/2018  . Hyperlipidemia 08/14/2018  . Prediabetes 08/14/2018  . Hypothyroidism 08/14/2018  . Adjustment insomnia 08/14/2018   Past Medical History:  Diagnosis Date  . Arthritis   . Chickenpox   . Hyperlipidemia   . Hypertension   . Hypothyroid     Family History  Problem Relation Age of Onset  . Arthritis Mother   . Hyperlipidemia Mother   . Heart attack Mother   .  Hypertension Mother   . Arthritis Father   . Early death Father   . Hypertension Father   . Kidney disease Father   . Arthritis Sister   . Heart attack Maternal Grandmother   . Diabetes Maternal Grandfather   . Stroke Paternal Grandmother   . Arthritis Sister   . Breast cancer Neg Hx     Past Surgical History:  Procedure Laterality Date  . ABDOMINAL HYSTERECTOMY  2002  . FOOT SURGERY Right    Social History   Occupational History  . Not on file  Tobacco Use  . Smoking status: Former Smoker    Last attempt to quit: 11/29/2013    Years since quitting: 5.0  . Smokeless tobacco: Never Used  Substance and Sexual  Activity  . Alcohol use: Not on file  . Drug use: Not on file  . Sexual activity: Not on file

## 2018-12-06 ENCOUNTER — Ambulatory Visit (INDEPENDENT_AMBULATORY_CARE_PROVIDER_SITE_OTHER): Payer: Medicare Other | Admitting: Orthopedic Surgery

## 2018-12-06 ENCOUNTER — Encounter (INDEPENDENT_AMBULATORY_CARE_PROVIDER_SITE_OTHER): Payer: Self-pay | Admitting: Orthopedic Surgery

## 2018-12-06 DIAGNOSIS — M1711 Unilateral primary osteoarthritis, right knee: Secondary | ICD-10-CM

## 2018-12-09 ENCOUNTER — Encounter (INDEPENDENT_AMBULATORY_CARE_PROVIDER_SITE_OTHER): Payer: Self-pay | Admitting: Orthopedic Surgery

## 2018-12-09 DIAGNOSIS — M1711 Unilateral primary osteoarthritis, right knee: Secondary | ICD-10-CM | POA: Diagnosis not present

## 2018-12-09 MED ORDER — LIDOCAINE HCL 1 % IJ SOLN
5.0000 mL | INTRAMUSCULAR | Status: AC | PRN
  Administered 2018-12-09: 5 mL

## 2018-12-09 MED ORDER — BUPIVACAINE HCL 0.25 % IJ SOLN
4.0000 mL | INTRAMUSCULAR | Status: AC | PRN
  Administered 2018-12-09: 4 mL via INTRA_ARTICULAR

## 2018-12-09 MED ORDER — METHYLPREDNISOLONE ACETATE 40 MG/ML IJ SUSP
40.0000 mg | INTRAMUSCULAR | Status: AC | PRN
  Administered 2018-12-09: 40 mg via INTRA_ARTICULAR

## 2018-12-09 NOTE — Progress Notes (Signed)
   Procedure Note  Patient: Jacqueline Smith             Date of Birth: [birth year]           MRN: [id number]             Visit Date: 12/06/2018  Procedures: Visit Diagnoses: Unilateral primary osteoarthritis, right knee  Large Joint Inj: R knee on 12/09/2018 9:38 AM Indications: diagnostic evaluation, joint swelling and pain Details: 18 G 1.5 in needle, superolateral approach  Arthrogram: No  Medications: 5 mL lidocaine 1 %; 40 mg methylPREDNISolone acetate 40 MG/ML; 4 mL bupivacaine 0.25 % Outcome: tolerated well, no immediate complications Procedure, treatment alternatives, risks and benefits explained, specific risks discussed. Consent was given by the patient. Immediately prior to procedure a time out was called to verify the correct patient, procedure, equipment, support staff and site/side marked as required. Patient was prepped and draped in the usual sterile fashion.     Right knee popliteal cyst also aspirated and injected with 1/2 cc of Depo-Medrol.  We did obtain about 30 cc of fluid from that popliteal cyst which was aspirated under ultrasound guidance.  This was a planned injection.

## 2018-12-22 ENCOUNTER — Telehealth: Payer: Self-pay | Admitting: Family Medicine

## 2018-12-22 DIAGNOSIS — E785 Hyperlipidemia, unspecified: Secondary | ICD-10-CM

## 2018-12-22 MED ORDER — PRAVASTATIN SODIUM 10 MG PO TABS: ORAL_TABLET | ORAL | 3 refills | Status: DC

## 2018-12-22 NOTE — Telephone Encounter (Signed)
Sent in refill and patient informed

## 2018-12-22 NOTE — Telephone Encounter (Signed)
Copied from [place] [id number]. Topic: Quick Communication - Rx Refill/Question >> Dec 22, 2018 10:27 AM Blase Mess A wrote: Medication: pravastatin (PRAVACHOL) 10 MG tablet [[id number]]   Has the patient contacted their pharmacy? Yes  (Agent: If no, request that the patient contact the pharmacy for the refill.) (Agent: If yes, when and what did the pharmacy advise?)  Preferred Pharmacy (with phone number [facility] street name[id number] [place], [place]. AT [address] [phone] (Phone) [phone] (Fax)    Agent: Please be advised that RX refills may take up to 3 business days. We ask that you follow-up with your pharmacy.

## 2018-12-25 ENCOUNTER — Other Ambulatory Visit: Payer: Self-pay | Admitting: Family Medicine

## 2019-01-09 DIAGNOSIS — M79671 Pain in right foot: Secondary | ICD-10-CM | POA: Diagnosis not present

## 2019-01-09 DIAGNOSIS — M79672 Pain in left foot: Secondary | ICD-10-CM | POA: Diagnosis not present

## 2019-01-09 DIAGNOSIS — B351 Tinea unguium: Secondary | ICD-10-CM | POA: Diagnosis not present

## 2019-02-07 ENCOUNTER — Ambulatory Visit: Payer: Medicare Other | Admitting: Family Medicine

## 2019-02-15 ENCOUNTER — Ambulatory Visit: Payer: Medicare Other | Admitting: Family Medicine

## 2019-03-15 ENCOUNTER — Other Ambulatory Visit: Payer: Self-pay | Admitting: Family Medicine

## 2019-03-15 DIAGNOSIS — Z1231 Encounter for screening mammogram for malignant neoplasm of breast: Secondary | ICD-10-CM

## 2019-03-20 DIAGNOSIS — M79671 Pain in right foot: Secondary | ICD-10-CM | POA: Diagnosis not present

## 2019-03-20 DIAGNOSIS — B351 Tinea unguium: Secondary | ICD-10-CM | POA: Diagnosis not present

## 2019-03-20 DIAGNOSIS — M79672 Pain in left foot: Secondary | ICD-10-CM | POA: Diagnosis not present

## 2019-03-28 ENCOUNTER — Encounter: Payer: Self-pay | Admitting: Family Medicine

## 2019-03-28 ENCOUNTER — Ambulatory Visit (INDEPENDENT_AMBULATORY_CARE_PROVIDER_SITE_OTHER): Payer: Medicare Other | Admitting: Family Medicine

## 2019-03-28 ENCOUNTER — Other Ambulatory Visit: Payer: Self-pay

## 2019-03-28 VITALS — BP 132/78 | HR 69 | Temp 98.1°F | Ht 64.0 in | Wt 185.0 lb

## 2019-03-28 DIAGNOSIS — E039 Hypothyroidism, unspecified: Secondary | ICD-10-CM | POA: Diagnosis not present

## 2019-03-28 DIAGNOSIS — E785 Hyperlipidemia, unspecified: Secondary | ICD-10-CM

## 2019-03-28 DIAGNOSIS — E2839 Other primary ovarian failure: Secondary | ICD-10-CM | POA: Diagnosis not present

## 2019-03-28 DIAGNOSIS — R7303 Prediabetes: Secondary | ICD-10-CM | POA: Diagnosis not present

## 2019-03-28 DIAGNOSIS — I10 Essential (primary) hypertension: Secondary | ICD-10-CM

## 2019-03-28 LAB — COMPREHENSIVE METABOLIC PANEL
ALT: 10 U/L (ref 0–35)
AST: 15 U/L (ref 0–37)
Albumin: 4 g/dL (ref 3.5–5.2)
Alkaline Phosphatase: 42 U/L (ref 39–117)
BUN: 16 mg/dL (ref 6–23)
CO2: 29 mEq/L (ref 19–32)
Calcium: 9.4 mg/dL (ref 8.4–10.5)
Chloride: 105 mEq/L (ref 96–112)
Creatinine, Ser: 0.61 mg/dL (ref 0.40–1.20)
GFR: 94.68 mL/min (ref >60.00)
Glucose, Bld: 94 mg/dL (ref 70–99)
Potassium: 4.1 mEq/L (ref 3.5–5.1)
Sodium: 141 mEq/L (ref 135–145)
Total Bilirubin: 0.7 mg/dL (ref 0.2–1.2)
Total Protein: 6.2 g/dL (ref 6.0–8.3)

## 2019-03-28 LAB — CBC
HCT: 39.8 % (ref 36.0–46.0)
Hemoglobin: 13.2 g/dL (ref 12.0–15.0)
MCHC: 33.1 g/dL (ref 30.0–36.0)
MCV: 99.4 fl (ref 78.0–100.0)
Platelets: 218 10*3/uL (ref 150.0–400.0)
RBC: 4 Mil/uL (ref 3.87–5.11)
RDW: 13.6 % (ref 11.5–15.5)
WBC: 6.4 10*3/uL (ref 4.0–10.5)

## 2019-03-28 LAB — LIPID PANEL
Cholesterol: 179 mg/dL (ref 0–200)
HDL: 74 mg/dL (ref >39.00)
LDL Cholesterol: 87 mg/dL (ref 0–99)
NonHDL: 104.65
Total CHOL/HDL Ratio: 2
Triglycerides: 90 mg/dL (ref 0.0–149.0)
VLDL: 18 mg/dL (ref 0.0–40.0)

## 2019-03-28 LAB — HEMOGLOBIN A1C: Hgb A1c MFr Bld: 5.6 % (ref 4.6–6.5)

## 2019-03-28 LAB — TSH: TSH: 0.44 u[IU]/mL (ref 0.35–4.50)

## 2019-03-28 LAB — T4, FREE: Free T4: 1.18 ng/dL (ref 0.60–1.60)

## 2019-03-28 NOTE — Progress Notes (Signed)
Chief Complaint  Patient presents with  . Follow-up    Subjective Jacqueline Smith is a 78 y.o. female who presents for hypertension follow up. She does not monitor home blood pressures. She is compliant with medications. Patient has these side effects of medication: none She is adhering to a healthy diet overall. Current exercise: walking  Hypothyroidism Patient presents for follow-up of hypothyroidism.  Reports compliance with medication. Current symptoms include: denies fatigue, weight changes, heat/cold intolerance, bowel/skin changes or CVS symptoms She believes her dose should be unchanged  Hyperlipidemia Patient presents for dyslipidemia follow up. Currently being treated with pravastatin 10 mg/d and compliance with treatment thus far has been good. She denies myalgias. She is adhering to a healthy diet. Exercise: walking The patient is not known to have coexisting coronary artery disease.  Past Medical History:  Diagnosis Date  . Arthritis   . Chickenpox   . Hyperlipidemia   . Hypertension   . Hypothyroid     Review of Systems Cardiovascular: no chest pain Respiratory:  no shortness of breath  Exam BP 132/78 (BP Location: Left Arm, Patient Position: Sitting, Cuff Size: Normal)   Pulse 69   Temp 98.1 F (36.7 C) (Oral)   Ht 5\' 4"  (1.626 m)   Wt 185 lb (83.9 kg)   SpO2 97%   BMI 31.76 kg/m  General:  well developed, well nourished, in no apparent distress Heart: RRR, no bruits, no LE edema Lungs: clear to auscultation, no accessory muscle use Psych: well oriented with normal range of affect and appropriate judgment/insight  Essential hypertension - Plan: Comprehensive metabolic panel, EKG [id number]  Prediabetes - Plan: Hemoglobin A1c, CBC  Hyperlipidemia, unspecified hyperlipidemia type - Plan: Lipid panel  Hypothyroidism, unspecified type - Plan: TSH, T4, free, CBC  Estrogen deficiency - Plan: DG Bone Density  Orders as above. EKG neg.   Counseled on diet and exercise. We are out of Tdaps. F/u in 6 mo. The patient voiced understanding and agreement to the plan.  [place], DO 03/28/19  10:55 AM

## 2019-03-28 NOTE — Patient Instructions (Addendum)
Give [place] 2-3 business days to get the results of your labs back.   Keep the diet clean and stay active.  Contact your orthopedic specialist for your knee.   Let us know if you need anything.

## 2019-04-05 ENCOUNTER — Encounter: Payer: Self-pay | Admitting: Orthopedic Surgery

## 2019-04-05 ENCOUNTER — Ambulatory Visit (INDEPENDENT_AMBULATORY_CARE_PROVIDER_SITE_OTHER): Payer: Medicare Other | Admitting: Orthopedic Surgery

## 2019-04-05 ENCOUNTER — Other Ambulatory Visit: Payer: Self-pay

## 2019-04-05 DIAGNOSIS — M1711 Unilateral primary osteoarthritis, right knee: Secondary | ICD-10-CM

## 2019-04-05 DIAGNOSIS — M1712 Unilateral primary osteoarthritis, left knee: Secondary | ICD-10-CM

## 2019-04-06 ENCOUNTER — Encounter: Payer: Self-pay | Admitting: Orthopedic Surgery

## 2019-04-06 DIAGNOSIS — M1711 Unilateral primary osteoarthritis, right knee: Secondary | ICD-10-CM

## 2019-04-06 MED ORDER — BUPIVACAINE HCL 0.25 % IJ SOLN
4.0000 mL | INTRAMUSCULAR | Status: AC | PRN
  Administered 2019-04-06: 4 mL via INTRA_ARTICULAR

## 2019-04-06 MED ORDER — LIDOCAINE HCL 1 % IJ SOLN
5.0000 mL | INTRAMUSCULAR | Status: AC | PRN
  Administered 2019-04-06: 5 mL

## 2019-04-06 MED ORDER — METHYLPREDNISOLONE ACETATE 40 MG/ML IJ SUSP
40.0000 mg | INTRAMUSCULAR | Status: AC | PRN
  Administered 2019-04-06: 40 mg via INTRA_ARTICULAR

## 2019-04-06 NOTE — Progress Notes (Signed)
Office Visit Note   Patient: Jacqueline Smith           Date of Birth: 01-26-1941           MRN: [id number] Visit Date: 04/05/2019 Requested by: Shelda Pal, [place],  [place] [address] PCP: Shelda Pal, DO  Subjective: Chief Complaint  Patient presents with  . Right Knee - Pain    HPI: Persephanie is a patient with right knee pain.  She actually has bilateral knee pain and arthritis as well as varicosities on the left.  She like to get referred for varicose vein treatment and I am going to do that with Dr. Andria Rhein per her request.  She has looked him up and feels like he has a lot of experience and that is what she is looking for.  She had knee injection on the right-hand side in February and that gave her good relief.  She wants to continue to be active.  Denies any interval history of injury or trauma..              ROS: All systems reviewed are negative as they relate to the chief complaint within the history of present illness.  Patient denies  fevers or chills.   Assessment & Plan: Visit Diagnoses:  1. Unilateral primary osteoarthritis, right knee     Plan: Impression is right knee pain with arthritis and left knee pain with less severe arthritis.  Patient requesting injections into both knees which is done today.  Referral for left leg varicosities given.  I will see her back in about 3 to 6 months if she wants to proceed with further injection treatment either cortisone or gel injections.  Follow-Up Instructions: Return if symptoms worsen or fail to improve.   Orders:  No orders of the defined types were placed in this encounter.  No orders of the defined types were placed in this encounter.     Procedures: Large Joint Inj: bilateral knee on 04/06/2019 10:32 PM Indications: diagnostic evaluation, joint swelling and pain Details: 18 G 1.5 in needle, superolateral approach  Arthrogram: No  Medications (Right):  5 mL lidocaine 1 %; 4 mL bupivacaine 0.25 %; 40 mg methylPREDNISolone acetate 40 MG/ML Medications (Left): 5 mL lidocaine 1 %; 4 mL bupivacaine 0.25 %; 40 mg methylPREDNISolone acetate 40 MG/ML Outcome: tolerated well, no immediate complications Procedure, treatment alternatives, risks and benefits explained, specific risks discussed. Consent was given by the patient. Immediately prior to procedure a time out was called to verify the correct patient, procedure, equipment, support staff and site/side marked as required. Patient was prepped and draped in the usual sterile fashion.       Clinical Data: No additional findings.  Objective: Vital Signs: There were no vitals taken for this visit.  Physical Exam:   Constitutional: Patient appears well-developed HEENT:  Head: Normocephalic Eyes:EOM are normal Neck: Normal range of motion Cardiovascular: Normal rate Pulmonary/chest: Effort normal Neurologic: Patient is alert Skin: Skin is warm Psychiatric: Patient has normal mood and affect    Ortho Exam: Ortho exam demonstrates full active and passive range of motion of both knees with no real effusion in either knee.  Pedal pulses palpable.  She does have varicosities more prominent on the left than the right but negative Homans sign.  Pedal pulses palpable.  No groin pain with internal X rotation of the leg.  Collateral and cruciate ligaments are stable.  Specialty Comments:  No specialty comments available.  Imaging: No results found.   PMFS History: Patient Active Problem List   Diagnosis Date Noted  . Skin lesion 11/09/2018  . Eczema 11/09/2018  . Essential hypertension 08/14/2018  . Hyperlipidemia 08/14/2018  . Prediabetes 08/14/2018  . Hypothyroidism 08/14/2018  . Adjustment insomnia 08/14/2018   Past Medical History:  Diagnosis Date  . Arthritis   . Chickenpox   . Hyperlipidemia   . Hypertension   . Hypothyroid     Family History  Problem Relation Age of  Onset  . Arthritis Mother   . Hyperlipidemia Mother   . Heart attack Mother   . Hypertension Mother   . Arthritis Father   . Hypertension Father   . Kidney disease Father   . Arthritis Sister   . Heart attack Maternal Grandmother   . Diabetes Maternal Grandfather   . Stroke Paternal Grandmother   . Arthritis Sister   . Breast cancer Neg Hx     Past Surgical History:  Procedure Laterality Date  . ABDOMINAL HYSTERECTOMY  2002  . FOOT SURGERY Right    Social History   Occupational History  . Not on file  Tobacco Use  . Smoking status: Former Smoker    Quit date: 11/29/2013    Years since quitting: 5.3  . Smokeless tobacco: Never Used  Substance and Sexual Activity  . Alcohol use: Not on file  . Drug use: Not on file  . Sexual activity: Not on file

## 2019-04-09 ENCOUNTER — Encounter: Payer: Self-pay | Admitting: Family Medicine

## 2019-04-10 ENCOUNTER — Telehealth: Payer: Self-pay | Admitting: Orthopedic Surgery

## 2019-04-10 DIAGNOSIS — M79604 Pain in right leg: Secondary | ICD-10-CM

## 2019-04-10 NOTE — Telephone Encounter (Signed)
Ok for this? 

## 2019-04-10 NOTE — Telephone Encounter (Signed)
Y pls refer to Jacqueline Smith or Jacqueline Smith

## 2019-04-10 NOTE — Telephone Encounter (Signed)
Pt lmom requesting a referral to [facility].

## 2019-04-10 NOTE — Telephone Encounter (Signed)
Put in order Tried calling patient to advise. No answer. No VM

## 2019-05-08 ENCOUNTER — Other Ambulatory Visit: Payer: Self-pay

## 2019-05-08 DIAGNOSIS — M79606 Pain in leg, unspecified: Secondary | ICD-10-CM

## 2019-05-14 ENCOUNTER — Telehealth (HOSPITAL_COMMUNITY): Payer: Self-pay

## 2019-05-14 NOTE — Telephone Encounter (Signed)
The above patient or their representative was contacted and gave the following answers to these questions:         Do you have any of the following symptoms? No  Fever                    Cough                   Shortness of breath  Do  you have any of the following other symptoms?  No   muscle pain         vomiting,        diarrhea        rash         weakness        red eye        abdominal pain         bruising          bruising or bleeding              joint pain           severe headache    Have you been in contact with someone who was or has been sick in the past 2 weeks? No  Yes                 Unsure                         Unable to assess   Does the person that you were in contact with have any of the following symptoms? N/A   Cough         shortness of breath           muscle pain         vomiting,            diarrhea            rash            weakness           fever            red eye           abdominal pain           bruising  or  bleeding                joint pain                severe headache                COMMENTS OR ACTION PLAN FOR THIS PATIENT:         

## 2019-05-15 ENCOUNTER — Other Ambulatory Visit: Payer: Self-pay | Admitting: *Deleted

## 2019-05-15 ENCOUNTER — Ambulatory Visit (HOSPITAL_COMMUNITY)
Admission: RE | Admit: 2019-05-15 | Discharge: 2019-05-15 | Disposition: A | Discharge status: Home / Self Care | Payer: Medicare Other | Source: Ambulatory Visit | Attending: Vascular Surgery | Admitting: Vascular Surgery

## 2019-05-15 ENCOUNTER — Other Ambulatory Visit: Payer: Self-pay

## 2019-05-15 ENCOUNTER — Encounter: Payer: Self-pay | Admitting: Vascular Surgery

## 2019-05-15 ENCOUNTER — Ambulatory Visit (INDEPENDENT_AMBULATORY_CARE_PROVIDER_SITE_OTHER): Payer: Medicare Other | Admitting: Vascular Surgery

## 2019-05-15 VITALS — BP 127/66 | HR 69 | Temp 97.6°F | Resp 20 | Ht 64.0 in | Wt 185.0 lb

## 2019-05-15 DIAGNOSIS — I8312 Varicose veins of left lower extremity with inflammation: Secondary | ICD-10-CM | POA: Diagnosis not present

## 2019-05-15 DIAGNOSIS — I83812 Varicose veins of left lower extremities with pain: Secondary | ICD-10-CM

## 2019-05-15 DIAGNOSIS — M79606 Pain in leg, unspecified: Secondary | ICD-10-CM

## 2019-05-15 NOTE — Progress Notes (Signed)
Vascular and Vein Specialist of   Patient name: Jacqueline Smith MRN: [id number] DOB: 08/09/1941 Sex: female  REASON FOR CONSULT: Evaluation lower extremity pain  HPI: Jacqueline Smith is a 78 y.o. female, who is here today for evaluation of lower extremity pain.  She reports progressive varicosities in her left medial thigh.  These began just above her knee and then extend down through her medial calf and pretibial area.  These are tender to touch especially after standing for any prolonged period of time.  She does have mild bilateral lower extremity swelling.  No history of DVT and no history of venous ulceration  Past Medical History:  Diagnosis Date  . Arthritis   . Chickenpox   . Hyperlipidemia   . Hypertension   . Hypothyroid     Family History  Problem Relation Age of Onset  . Arthritis Mother   . Hyperlipidemia Mother   . Heart attack Mother   . Hypertension Mother   . Arthritis Father   . Hypertension Father   . Kidney disease Father   . Arthritis Sister   . Heart attack Maternal Grandmother   . Diabetes Maternal Grandfather   . Stroke Paternal Grandmother   . Arthritis Sister   . Breast cancer Neg Hx     SOCIAL HISTORY: Social History   Socioeconomic History  . Marital status: Married    Spouse name: Not on file  . Number of children: Not on file  . Years of education: Not on file  . Highest education level: Not on file  Occupational History  . Not on file  Social Needs  . Financial resource strain: Not on file  . Food insecurity    Worry: Not on file    Inability: Not on file  . Transportation needs    Medical: Not on file    Non-medical: Not on file  Tobacco Use  . Smoking status: Former Smoker    Quit date: 11/29/2013    Years since quitting: 5.4  . Smokeless tobacco: Never Used  Substance and Sexual Activity  . Alcohol use: Yes    Alcohol/week: 20.0 standard drinks    Types: 20 Glasses of  wine per week  . Drug use: Never  . Sexual activity: Not on file  Lifestyle  . Physical activity    Days per week: 4 days    Minutes per session: 60 min  . Stress: Not on file  Relationships  . Social Herbalist on phone: Once a week    Gets together: Never    Attends religious service: More than 4 times per year    Active member of club or organization: No    Attends meetings of clubs or organizations: Never    Relationship status: Married  . Intimate partner violence    Fear of current or ex partner: No    Emotionally abused: No    Physically abused: No    Forced sexual activity: No  Other Topics Concern  . Not on file  Social History Narrative  . Not on file    Allergies  Allergen Reactions  . Demerol [Meperidine Hcl]   . Iodine   . Keflex [Cephalexin]   . Macrobid [Nitrofurantoin Macrocrystal]   . Mevacor [Lovastatin]   . Morphine And Related   . Sulfa Antibiotics   . Terramycin [Oxytetracycline]   . Vicodin [Hydrocodone-Acetaminophen]     Current Outpatient Medications  Medication Sig Dispense Refill  . acetaminophen (  TYLENOL) 500 MG tablet Take 500 mg by mouth every 6 (six) hours as needed.    . cyanocobalamin 1000 MCG tablet Take by mouth daily.    Marland Kitchen glucosamine-chondroitin 500-400 MG tablet Take 1 tablet by mouth 3 (three) times daily.    Marland Kitchen losartan-hydrochlorothiazide (HYZAAR) 100-25 MG tablet TAKE 1 TABLET BY MOUTH EVERY DAY 90 tablet 3  . pravastatin (PRAVACHOL) 10 MG tablet TAKE 1 TABLET BY MOUTH EVERYDAY AT BEDTIME 90 tablet 3  . SYNTHROID 112 MCG tablet Take 1 tablet (112 mcg total) by mouth daily before breakfast. 90 tablet 3  . temazepam (RESTORIL) 15 MG capsule TAKE 1 CAPSULE BY MOUTH AT BEDTIME AS NEEDED FOR SLEEP. 30 capsule 3  . triamcinolone cream (KENALOG) 0.1 % APPLY TO AFFECTED AREA TWICE A DAY 30 g 0   No current facility-administered medications for this visit.     REVIEW OF SYSTEMS:  [X]  denotes positive finding, [ ]  denotes  negative finding Cardiac  Comments:  Chest pain or chest pressure:    Shortness of breath upon exertion:    Short of breath when lying flat:    Irregular heart rhythm:        Vascular    Pain in calf, thigh, or hip brought on by ambulation:    Pain in feet at night that wakes you up from your sleep:     Blood clot in your veins:    Leg swelling:         Pulmonary    Oxygen at home:    Productive cough:     Wheezing:         Neurologic    Sudden weakness in arms or legs:     Sudden numbness in arms or legs:     Sudden onset of difficulty speaking or slurred speech:    Temporary loss of vision in one eye:     Problems with dizziness:         Gastrointestinal    Blood in stool:     Vomited blood:         Genitourinary    Burning when urinating:     Blood in urine:        Psychiatric    Major depression:         Hematologic    Bleeding problems:    Problems with blood clotting too easily:        Skin    Rashes or ulcers:        Constitutional    Fever or chills:      PHYSICAL EXAM: Vitals:   05/15/19 1413  BP: 127/66  Pulse: 69  Resp: 20  Temp: 97.6 F (36.4 C)  TempSrc: Temporal  SpO2: 97%  Weight: 185 lb (83.9 kg)  Height: 5\' 4"  (1.626 m)    GENERAL: The patient is a well-nourished female, in no acute distress. The vital signs are documented above. CARDIOVASCULAR: 2+ radial and 2+ dorsalis pedis pulses bilaterally.  No significant varicosities right leg.  Large varicosities beginning in her medial distal thigh extending onto her calf and pretibial area PULMONARY: There is good air exchange  ABDOMEN: Soft and non-tender  MUSCULOSKELETAL: There are no major deformities or cyanosis. NEUROLOGIC: No focal weakness or paresthesias are detected. SKIN: There are no ulcers or rashes noted. PSYCHIATRIC: The patient has a normal affect.  DATA:  Noninvasive arterial studies reveal normal ankle arm index bilaterally  I did image her veins with SonoSite  ultrasound and this  does show large saphenous vein with flow directly into these large varicosities  MEDICAL ISSUES: I reassured the patient she does not have any evidence of arterial disease.  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  We will fit her with thigh-high graduated compression garments and stressed the importance of elevation when possible and ibuprofen as well.  We will see her again in 3 months.  She would be a good candidate for ablation of her great saphenous vein and stab phlebectomy of the large tributary varicosities.  She will return in 3 months and see either Dr. Scot Dock or Oneida Alar for continued evaluation  CEAP C3   Rosetta Posner, MD [facility] Vascular and Vein Specialists of [facility] Tel [phone] Pager [phone]

## 2019-05-21 ENCOUNTER — Other Ambulatory Visit: Payer: Self-pay | Admitting: Family Medicine

## 2019-05-21 DIAGNOSIS — F5102 Adjustment insomnia: Secondary | ICD-10-CM

## 2019-05-21 MED ORDER — TEMAZEPAM 15 MG PO CAPS: ORAL_CAPSULE | ORAL | 3 refills | Status: DC

## 2019-05-21 NOTE — Telephone Encounter (Signed)
Pt needs a a new Rx sent to pharmacy for temazepam (RESTORIL) 15 MG capsule   [id number] - [place], [place] - [address] AT [address] [place] [phone] (Phone) [phone] (Fax)   Please advise

## 2019-05-22 ENCOUNTER — Ambulatory Visit: Payer: Medicare Other | Admitting: *Deleted

## 2019-05-22 ENCOUNTER — Other Ambulatory Visit: Payer: Self-pay

## 2019-05-22 DIAGNOSIS — I83812 Varicose veins of left lower extremities with pain: Secondary | ICD-10-CM

## 2019-05-22 NOTE — Progress Notes (Signed)
Measured and fitted patient for thigh high compression hose 20-30 mm HG.  Ankle 22 cm  Calf 36 cm thigh 60 cm.  Size Medium.  Reviewed care of hose and put hose on Mrs. Popescu to assure proper fit.  Answered Mrs/ Wigal"s questions.  She verbalized understanding.

## 2019-05-25 DIAGNOSIS — B351 Tinea unguium: Secondary | ICD-10-CM | POA: Diagnosis not present

## 2019-05-25 DIAGNOSIS — M79671 Pain in right foot: Secondary | ICD-10-CM | POA: Diagnosis not present

## 2019-05-25 DIAGNOSIS — M79672 Pain in left foot: Secondary | ICD-10-CM | POA: Diagnosis not present

## 2019-06-07 ENCOUNTER — Other Ambulatory Visit: Payer: Self-pay

## 2019-06-07 ENCOUNTER — Encounter: Payer: Self-pay | Admitting: Emergency Medicine

## 2019-06-07 ENCOUNTER — Ambulatory Visit
Admission: EM | Admit: 2019-06-07 | Discharge: 2019-06-07 | Disposition: A | Discharge status: Home / Self Care | Payer: Medicare Other

## 2019-06-07 DIAGNOSIS — W228XXA Striking against or struck by other objects, initial encounter: Secondary | ICD-10-CM | POA: Diagnosis not present

## 2019-06-07 DIAGNOSIS — S51812A Laceration without foreign body of left forearm, initial encounter: Secondary | ICD-10-CM | POA: Diagnosis not present

## 2019-06-07 NOTE — ED Notes (Signed)
Patient able to ambulate independently  

## 2019-06-07 NOTE — ED Provider Notes (Signed)
[name]    CSN: [id number] Arrival date & time: 06/07/19  1017      History   Chief Complaint Chief Complaint  Patient presents with  . skin tear    HPI Jacqueline Smith is a 78 y.o. female with history of hypertension, arthritis, hypothyroidism presenting for left forearm skin tear.  Patient states this happened yesterday when she tried catching to the other fridge and bumped her arm against the door.  Patient denies fall, arm/wrist pain, paresthesia, weakness.  Patient is not currently on anticoagulant/blood thinner denies prolonged bleeding.   Past Medical History:  Diagnosis Date  . Arthritis   . Chickenpox   . Hyperlipidemia   . Hypertension   . Hypothyroid     Patient Active Problem List   Diagnosis Date Noted  . Skin lesion 11/09/2018  . Eczema 11/09/2018  . Essential hypertension 08/14/2018  . Hyperlipidemia 08/14/2018  . Prediabetes 08/14/2018  . Hypothyroidism 08/14/2018  . Adjustment insomnia 08/14/2018    Past Surgical History:  Procedure Laterality Date  . ABDOMINAL HYSTERECTOMY  2002  . FOOT SURGERY Right     OB History   No obstetric history on file.      Home Medications    Prior to Admission medications   Medication Sig Start Date End Date Taking? Authorizing Provider  acetaminophen (TYLENOL) 500 MG tablet Take 500 mg by mouth every 6 (six) hours as needed.    [provider]  cyanocobalamin 1000 MCG tablet Take by mouth daily.    [provider]  glucosamine-chondroitin 500-400 MG tablet Take 1 tablet by mouth 3 (three) times daily.    [provider]  losartan-hydrochlorothiazide (HYZAAR) 100-25 MG tablet TAKE 1 TABLET BY MOUTH EVERY DAY 12/25/18   Shelda Pal, DO  pravastatin (PRAVACHOL) 10 MG tablet TAKE 1 TABLET BY MOUTH EVERYDAY AT BEDTIME 12/22/18   Shelda Pal, DO  SYNTHROID 112 MCG tablet Take 1 tablet (112 mcg total) by mouth daily before breakfast. 11/17/18    Nani Ravens, Crosby Oyster, DO  temazepam (RESTORIL) 15 MG capsule TAKE 1 CAPSULE BY MOUTH AT BEDTIME AS NEEDED FOR SLEEP. 05/21/19   Shelda Pal, DO  triamcinolone cream (KENALOG) 0.1 % APPLY TO AFFECTED AREA TWICE A DAY 09/25/18   Shelda Pal, DO    Family History Family History  Problem Relation Age of Onset  . Arthritis Mother   . Hyperlipidemia Mother   . Heart attack Mother   . Hypertension Mother   . Arthritis Father   . Hypertension Father   . Kidney disease Father   . Arthritis Sister   . Heart attack Maternal Grandmother   . Diabetes Maternal Grandfather   . Stroke Paternal Grandmother   . Arthritis Sister   . Breast cancer Neg Hx     Social History Social History   Tobacco Use  . Smoking status: Former Smoker    Quit date: 11/29/2013    Years since quitting: 5.5  . Smokeless tobacco: Never Used  Substance Use Topics  . Alcohol use: Yes    Alcohol/week: 20.0 standard drinks    Types: 20 Glasses of wine per week  . Drug use: Never     Allergies   Demerol [meperidine hcl], Iodine, Keflex [cephalexin], Macrobid [nitrofurantoin macrocrystal], Mevacor [lovastatin], Morphine and related, Sulfa antibiotics, Terramycin [oxytetracycline], and Vicodin [hydrocodone-acetaminophen]   Review of Systems Review of Systems  Constitutional: Negative for fatigue and fever.  HENT: Negative for ear pain, sinus pain,  sore throat and voice change.   Eyes: Negative for pain, redness and visual disturbance.  Respiratory: Negative for cough and shortness of breath.   Cardiovascular: Negative for chest pain and palpitations.  Gastrointestinal: Negative for abdominal pain, diarrhea and vomiting.  Musculoskeletal: Negative for arthralgias and myalgias.  Skin: Positive for wound. Negative for rash.  Neurological: Negative for syncope and headaches.     Physical Exam Triage Vital Signs ED Triage Vitals  Enc Vitals Group     BP      Pulse      Resp      Temp       Temp src      SpO2      Weight      Height      Head Circumference      Peak Flow      Pain Score      Pain Loc      Pain Edu?      Excl. in [place]?    No data found.  Updated Vital Signs BP (!) 108/58 (BP Location: Left Arm)   Pulse 79   Temp 98.2 F (36.8 C) (Oral)   Resp 16   SpO2 97%   Visual Acuity Right Eye Distance:   Left Eye Distance:   Bilateral Distance:    Right Eye Near:   Left Eye Near:    Bilateral Near:     Physical Exam Constitutional:      General: She is not in acute distress. HENT:     Head: Normocephalic and atraumatic.  Eyes:     General: No scleral icterus.    Pupils: Pupils are equal, round, and reactive to light.  Cardiovascular:     Rate and Rhythm: Normal rate.  Pulmonary:     Effort: Pulmonary effort is normal.  Musculoskeletal: Normal range of motion.        General: No swelling or tenderness.     Comments: 5/5 grip strength bilaterally.   Skin:    Coloration: Skin is not jaundiced or pale.     Comments: 5 cm superficial skin tear with slow ooze of serosanguineous fluid.  Nontender to palpation.  No warmth, purulent discharge.  NVI  Neurological:     Mental Status: She is alert and oriented to person, place, and time.      UC Treatments / Results  Labs (all labs ordered are listed, but only abnormal results are displayed) Labs Reviewed - No data to display  EKG   Radiology No results found.  Procedures Procedures (including critical care time)  Medications Ordered in UC Medications - No data to display  Initial Impression / Assessment and Plan / UC Course  I have reviewed the triage vital signs and the nursing notes.  Pertinent labs & imaging results that were available during my care of the patient were reviewed by me and considered in my medical decision making (see chart for details).     1.  Skin tear Patient is wound dressed with nonadherent gauze, Coban in office which he tolerated well.  Reviewed  proper wound care as listed below.  Return precautions discussed, patient verbalized understanding and is agreeable to plan. Final Clinical Impressions(s) / UC Diagnoses   Final diagnoses:  Skin tear of left forearm without complication, initial encounter     Discharge Instructions     Keep area clean and dry: May wash with mild soap, pat dry with towel. May change dressing daily, and stop once skin  has healed over. Return for worsening pain, bleeding, swelling, pain, fever.  Dressing used today: Nonadherent gauze wrapped with coban    ED Prescriptions    None     Controlled Substance Prescriptions [place] Controlled Substance Registry consulted? Not Applicable   Quincy Sheehan, [place] 06/07/19 1048

## 2019-06-07 NOTE — Discharge Instructions (Addendum)
Keep area clean and dry: May wash with mild soap, pat dry with towel. May change dressing daily, and stop once skin has healed over. Return for worsening pain, bleeding, swelling, pain, fever.  Dressing used today: Nonadherent gauze wrapped with coban

## 2019-06-07 NOTE — ED Triage Notes (Signed)
Patient presents to [facility] for assessment of skin tear to left forearm, which happened yesterday when she tried to catch something falling out the fridge and slid/slammed her arm into the plastic fridge pieces.  Patient denies pain.  States she cleaned it with alcohol and covered the area, and has not touched it since.  Denies pain.

## 2019-06-08 ENCOUNTER — Telehealth: Payer: Self-pay | Admitting: Family Medicine

## 2019-06-08 NOTE — Telephone Encounter (Signed)
Copied from [place] [id number]. Topic: General - Inquiry >> Jun 08, 2019  2:28 PM Scherrie Gerlach wrote: Reason for CRM: pt wants to ask a couple of question for Dr Nani Ravens asst.  Pt did not express interest in sharing with me.  Patient had a skin tear on forearm She went to UC on Thursday. Provider put in non adhesive bandage/and coban. Patient stated that she is afraid to shower due to concern of skin tear bleeding. Ok to shower with bandage on she is afraid of bleeding more if she removes it all Also ok to put neoporin on the skin tear/rewrap

## 2019-06-08 NOTE — Telephone Encounter (Signed)
She can shower with it on or off. It might be easier to take off when it is wet.

## 2019-06-11 NOTE — Telephone Encounter (Signed)
Called informed of PCP instructions. 

## 2019-06-13 ENCOUNTER — Encounter: Payer: Self-pay | Admitting: Family Medicine

## 2019-06-13 ENCOUNTER — Ambulatory Visit (INDEPENDENT_AMBULATORY_CARE_PROVIDER_SITE_OTHER): Payer: Medicare Other | Admitting: Family Medicine

## 2019-06-13 ENCOUNTER — Other Ambulatory Visit: Payer: Self-pay

## 2019-06-13 VITALS — BP 118/83 | HR 76 | Temp 97.3°F | Ht 65.0 in | Wt 187.5 lb

## 2019-06-13 DIAGNOSIS — S41102A Unspecified open wound of left upper arm, initial encounter: Secondary | ICD-10-CM

## 2019-06-13 DIAGNOSIS — Z23 Encounter for immunization: Secondary | ICD-10-CM

## 2019-06-13 NOTE — Patient Instructions (Addendum)
When you do wash it, use only soap and water. Do not vigorously scrub. Apply triple antibiotic ointment (like Neosporin) twice daily. Keep the area clean and dry.   Things to look out for: increasing pain not relieved by ibuprofen/acetaminophen, fevers, spreading redness, drainage of pus, or foul odor.  I recommend getting the flu shot in mid October. This suggestion would change if the CDC comes out with a different recommendation.   Let us know if you need anything.

## 2019-06-13 NOTE — Progress Notes (Signed)
Chief Complaint  Patient presents with  . check skin tear    Jacqueline Smith is a 78 y.o. female here for a skin complaint.  Duration: 1 week Location: L forearm Pruritic? No Painful? No Drainage? Yes- some clear/yellow drainage on dressings New soaps/lotions/topicals/detergents?  Other associated symptoms: none Therapies tried thus far: Neosporin  ROS:  Const: No fevers Skin: As noted in HPI  Past Medical History:  Diagnosis Date  . Arthritis   . Chickenpox   . Hyperlipidemia   . Hypertension   . Hypothyroid     BP 118/83 (BP Location: Left Arm, Patient Position: Sitting, Cuff Size: Normal)   Pulse 76   Temp (!) 97.3 F (36.3 C) (Temporal)   Ht 5\' 5"  (1.651 m)   Wt 187 lb 8 oz (85 kg)   SpO2 95%   BMI 31.20 kg/m  Gen: awake, alert, appearing stated age Lungs: No accessory muscle use Skin: V-shaped wound with flap of skin overlying posterior L forearm. No drainage, erythema, excessive warmth, fluctuance, excoriation Psych: Age appropriate judgment and insight  Open wound of left upper extremity, initial encounter  Need for tetanus booster - Plan: Tdap vaccine greater than or equal to 7yo IM  TAO bid. Keep clean and dry. Warning signs and symptoms verbalized and written down in AVS.  F/u prn. The patient voiced understanding and agreement to the plan.  [place], DO 06/13/19 12:26 PM

## 2019-06-18 DIAGNOSIS — D2262 Melanocytic nevi of left upper limb, including shoulder: Secondary | ICD-10-CM | POA: Diagnosis not present

## 2019-06-18 DIAGNOSIS — D1801 Hemangioma of skin and subcutaneous tissue: Secondary | ICD-10-CM | POA: Diagnosis not present

## 2019-06-18 DIAGNOSIS — L720 Epidermal cyst: Secondary | ICD-10-CM | POA: Diagnosis not present

## 2019-06-18 DIAGNOSIS — L821 Other seborrheic keratosis: Secondary | ICD-10-CM | POA: Diagnosis not present

## 2019-06-18 DIAGNOSIS — L570 Actinic keratosis: Secondary | ICD-10-CM | POA: Diagnosis not present

## 2019-06-18 DIAGNOSIS — D2261 Melanocytic nevi of right upper limb, including shoulder: Secondary | ICD-10-CM | POA: Diagnosis not present

## 2019-06-22 DIAGNOSIS — Z23 Encounter for immunization: Secondary | ICD-10-CM | POA: Diagnosis not present

## 2019-07-09 ENCOUNTER — Other Ambulatory Visit: Payer: Self-pay

## 2019-07-09 ENCOUNTER — Ambulatory Visit
Admission: RE | Admit: 2019-07-09 | Discharge: 2019-07-09 | Disposition: A | Discharge status: Home / Self Care | Payer: Medicare Other | Source: Ambulatory Visit | Attending: Family Medicine | Admitting: Family Medicine

## 2019-07-09 DIAGNOSIS — E2839 Other primary ovarian failure: Secondary | ICD-10-CM

## 2019-07-09 DIAGNOSIS — M8589 Other specified disorders of bone density and structure, multiple sites: Secondary | ICD-10-CM | POA: Diagnosis not present

## 2019-07-09 DIAGNOSIS — Z1231 Encounter for screening mammogram for malignant neoplasm of breast: Secondary | ICD-10-CM | POA: Diagnosis not present

## 2019-07-09 DIAGNOSIS — Z780 Asymptomatic menopausal state: Secondary | ICD-10-CM | POA: Diagnosis not present

## 2019-07-10 ENCOUNTER — Encounter: Payer: Self-pay | Admitting: Family Medicine

## 2019-07-10 DIAGNOSIS — M8580 Other specified disorders of bone density and structure, unspecified site: Secondary | ICD-10-CM | POA: Insufficient documentation

## 2019-07-25 ENCOUNTER — Other Ambulatory Visit: Payer: Self-pay | Admitting: Family Medicine

## 2019-07-25 ENCOUNTER — Other Ambulatory Visit: Payer: Self-pay

## 2019-07-25 ENCOUNTER — Ambulatory Visit (INDEPENDENT_AMBULATORY_CARE_PROVIDER_SITE_OTHER): Payer: Medicare Other | Admitting: Family Medicine

## 2019-07-25 ENCOUNTER — Encounter: Payer: Self-pay | Admitting: Family Medicine

## 2019-07-25 VITALS — BP 140/80 | HR 91 | Temp 97.3°F | Ht 65.0 in | Wt 187.1 lb

## 2019-07-25 DIAGNOSIS — Z7289 Other problems related to lifestyle: Secondary | ICD-10-CM | POA: Diagnosis not present

## 2019-07-25 DIAGNOSIS — M8580 Other specified disorders of bone density and structure, unspecified site: Secondary | ICD-10-CM

## 2019-07-25 DIAGNOSIS — R5383 Other fatigue: Secondary | ICD-10-CM | POA: Diagnosis not present

## 2019-07-25 DIAGNOSIS — Z789 Other specified health status: Secondary | ICD-10-CM

## 2019-07-25 DIAGNOSIS — L659 Nonscarring hair loss, unspecified: Secondary | ICD-10-CM | POA: Diagnosis not present

## 2019-07-25 DIAGNOSIS — R790 Abnormal level of blood mineral: Secondary | ICD-10-CM

## 2019-07-25 LAB — T4, FREE: Free T4: 1.47 ng/dL (ref 0.60–1.60)

## 2019-07-25 LAB — HEPATIC FUNCTION PANEL
ALT: 15 U/L (ref 0–35)
AST: 19 U/L (ref 0–37)
Albumin: 4.3 g/dL (ref 3.5–5.2)
Alkaline Phosphatase: 45 U/L (ref 39–117)
Bilirubin, Direct: 0.2 mg/dL (ref 0.0–0.3)
Total Bilirubin: 0.8 mg/dL (ref 0.2–1.2)
Total Protein: 6.6 g/dL (ref 6.0–8.3)

## 2019-07-25 LAB — TSH: TSH: 0.2 u[IU]/mL — ABNORMAL LOW (ref 0.35–4.50)

## 2019-07-25 LAB — IBC + FERRITIN
Ferritin: 125.4 ng/mL (ref 10.0–291.0)
Iron: 146 ug/dL — ABNORMAL HIGH (ref 42–145)
Saturation Ratios: 46.1 % (ref 20.0–50.0)
Transferrin: 226 mg/dL (ref 212.0–360.0)

## 2019-07-25 NOTE — Progress Notes (Signed)
Chief Complaint  Patient presents with  . Follow-up    Subjective: Patient is a 78 y.o. female here for a handful of issues.  Patient had a recent bone density scan is wishing to discuss results.  She was diagnosed with osteopenia.  She is wondering whether she should take calcium or vitamin D.  She does try to walk.  She is also been experiencing hair thinning over the past several weeks.  She does not put any new products in her hair or pull her hair tightly.  She has a history of hypothyroidism.  She is also wondering if her iron levels are low.  She is compliant with her medication.  Patient reports increased fatigue and irritability.  She has been frustrated with her home situation as she is living with her daughter and husband.  They are not as clean as she is.  She does not feel any particular anxiety or depression but realizes she may have situational anxiety due to this.  No homicidal or suicidal ideation.  She is not currently on any medication.  She has been drinking around 3 glasses of wine daily.   ROS: Heart: Denies chest pain  Lungs: Denies SOB   Past Medical History:  Diagnosis Date  . Arthritis   . Chickenpox   . Hyperlipidemia   . Hypertension   . Hypothyroid   . Osteopenia     Objective: BP 140/80 (BP Location: Left Arm, Patient Position: Sitting, Cuff Size: Normal)   Pulse 91   Temp (!) 97.3 F (36.3 C) (Temporal)   Ht 5\' 5"  (1.651 m)   Wt 187 lb 2 oz (84.9 kg)   SpO2 95%   BMI 31.14 kg/m  General: Awake, appears stated age HEENT: MMM, EOMi Heart: RRR, no murmurs Lungs: CTAB, no rales, wheezes or rhonchi. No accessory muscle use Psych: Age appropriate judgment and insight, normal affect and mood  Assessment and Plan: Osteopenia, unspecified location  Hair thinning - Plan: IBC + Ferritin, TSH, T4, free  Fatigue, unspecified type  Alcohol use - Plan: Hepatic function panel  1-calcium and vitamin D recommended.  Weightbearing exercises  well. 2-check labs, may be age-related. 3-could be related to situational anxiety/depression.  Counseled on diet and exercise. 4-we will check liver function. Follow-up as originally scheduled pending above. The patient voiced understanding and agreement to the plan.  [place], DO 07/25/19  11:59 AM

## 2019-07-25 NOTE — Patient Instructions (Addendum)
Calcium- 1200 mg/d Vit D- 1000 units/d  Keep the diet clean and stay active.  Give [place] 2-3 business days to get the results of your labs back.   OK to use Debrox (peroxide) in the ear to loosen up wax. Also recommend using a bulb syringe (for removing boogers from baby's noses) to flush through warm water. Do not use Q-tips as this can impact wax further.   Let us know if you need anything.

## 2019-07-30 DIAGNOSIS — M79672 Pain in left foot: Secondary | ICD-10-CM | POA: Diagnosis not present

## 2019-07-30 DIAGNOSIS — M79671 Pain in right foot: Secondary | ICD-10-CM | POA: Diagnosis not present

## 2019-07-30 DIAGNOSIS — B351 Tinea unguium: Secondary | ICD-10-CM | POA: Diagnosis not present

## 2019-07-31 ENCOUNTER — Telehealth: Payer: Self-pay | Admitting: Family Medicine

## 2019-07-31 NOTE — Telephone Encounter (Signed)
I have spoken to her regarding her lab results, but she is requesting to speak Dr. Nani Ravens.

## 2019-07-31 NOTE — Telephone Encounter (Signed)
Pt states she received results from [facility] and also [place].  Multiple questions regarding thyroid labs; questioning why these were not addressed. Reviewed note from Dr. Nani Ravens stated he would not increase thyroid meds at this time. Requesting CB from Dr. Nani Ravens, not a nurse. Advised [place] would route request to practice. Also has F/U labs,  IBC + Ferritin,on 10/21. States when those resulted she would like call from Dr. Nani Ravens as well.   CB# [phone]

## 2019-08-01 ENCOUNTER — Ambulatory Visit (INDEPENDENT_AMBULATORY_CARE_PROVIDER_SITE_OTHER): Payer: Medicare Other | Admitting: Family Medicine

## 2019-08-01 ENCOUNTER — Other Ambulatory Visit: Payer: Self-pay

## 2019-08-01 ENCOUNTER — Encounter: Payer: Self-pay | Admitting: Family Medicine

## 2019-08-01 DIAGNOSIS — E039 Hypothyroidism, unspecified: Secondary | ICD-10-CM | POA: Diagnosis not present

## 2019-08-01 NOTE — Telephone Encounter (Signed)
Called and scheduled telephone call at 3 today with PCP to discuss

## 2019-08-01 NOTE — Progress Notes (Signed)
Chief Complaint  Patient presents with  . Results    Subjective: Patient is a 78 y.o. female here for discussion of lab results. Due to COVID-19 pandemic, we are interacting via telephone. I verified patient's ID using 2 identifiers. Patient agreed to proceed with visit via this method. Patient is at home, I am at office. Patient and I are present for visit.   Patient wishes to discuss her labs.  She thinks her thyroid is low after seeing her TSH results.  TSH was 0.2, free T4 was 1.47.  She is compliant with her 112 mcg of Synthroid.  She is still having issues with hair thinning and having no hair under legs or underarms.   ROS:  Skin: As noted in HPI  Past Medical History:  Diagnosis Date  . Arthritis   . Chickenpox   . Hyperlipidemia   . Hypertension   . Hypothyroid   . Osteopenia     Objective: No conversational dyspnea Age appropriate judgment and insight Nml affect and mood  Assessment and Plan: Hypothyroidism, unspecified type  We discussed lab results and thyroid physiology.  Agreed to leave medication alone for now.  There is no laboratory evidence for her fatigue or skin changes.  She is likely having issues with situational anxiety/depression and physiologic changes with aging and respectively. Total time spent: 11 min. The patient voiced understanding and agreement to the plan.  [place], DO 08/01/19  3:25 PM

## 2019-08-01 NOTE — Telephone Encounter (Signed)
OK to sched telephone/E visit. Ty.

## 2019-08-03 DIAGNOSIS — L738 Other specified follicular disorders: Secondary | ICD-10-CM | POA: Diagnosis not present

## 2019-08-03 DIAGNOSIS — L720 Epidermal cyst: Secondary | ICD-10-CM | POA: Diagnosis not present

## 2019-08-08 ENCOUNTER — Other Ambulatory Visit (INDEPENDENT_AMBULATORY_CARE_PROVIDER_SITE_OTHER): Payer: Medicare Other

## 2019-08-08 ENCOUNTER — Other Ambulatory Visit: Payer: Self-pay

## 2019-08-08 DIAGNOSIS — R790 Abnormal level of blood mineral: Secondary | ICD-10-CM | POA: Diagnosis not present

## 2019-08-08 LAB — IBC + FERRITIN
Ferritin: 138.4 ng/mL (ref 10.0–291.0)
Iron: 92 ug/dL (ref 42–145)
Saturation Ratios: 28.1 % (ref 20.0–50.0)
Transferrin: 234 mg/dL (ref 212.0–360.0)

## 2019-08-16 ENCOUNTER — Ambulatory Visit (INDEPENDENT_AMBULATORY_CARE_PROVIDER_SITE_OTHER): Payer: Medicare Other | Admitting: Vascular Surgery

## 2019-08-16 ENCOUNTER — Other Ambulatory Visit: Payer: Self-pay

## 2019-08-16 ENCOUNTER — Encounter: Payer: Self-pay | Admitting: Vascular Surgery

## 2019-08-16 ENCOUNTER — Ambulatory Visit (HOSPITAL_COMMUNITY)
Admission: RE | Admit: 2019-08-16 | Discharge: 2019-08-16 | Disposition: A | Discharge status: Home / Self Care | Payer: Medicare Other | Source: Ambulatory Visit | Attending: Vascular Surgery | Admitting: Vascular Surgery

## 2019-08-16 VITALS — BP 121/77 | HR 95 | Temp 97.1°F | Resp 18 | Ht 63.0 in | Wt 185.1 lb

## 2019-08-16 DIAGNOSIS — I83812 Varicose veins of left lower extremities with pain: Secondary | ICD-10-CM

## 2019-08-16 DIAGNOSIS — I872 Venous insufficiency (chronic) (peripheral): Secondary | ICD-10-CM | POA: Diagnosis not present

## 2019-08-16 NOTE — Progress Notes (Signed)
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 female who was seen by Dr. Sherren Mocha Early on 05/15/2019.  She had painful varicose veins of her left medial thigh.  On exam she had large varicosities in her medial distal thigh extending into her calf and pretibial area.  She had a normal arterial Doppler study at that time and had palpable dorsalis pedis pulses.  She was fitted in thigh-high compression stockings with a gradient of 20 to 30 mmHg.  She was encouraged to elevate her legs when possible and take ibuprofen as needed for pain.  She comes in for 26-month follow-up visit.  Since she was seen last she does describe some tiredness in her legs but her symptoms are quite tolerable.  She does wear her thigh-high compression stockings which do make her legs feel better.  She elevates her legs some.  She does not like to take ibuprofen.  She is fairly active and walks quite a bit.  She is had no previous history of DVT and no bleeding episodes from her varicose veins.  Past Medical History:  Diagnosis Date  . Arthritis   . Chickenpox   . Hyperlipidemia   . Hypertension   . Hypothyroid   . Osteopenia     Family History  Problem Relation Age of Onset  . Arthritis Mother   . Hyperlipidemia Mother   . Heart attack Mother   . Hypertension Mother   . Arthritis Father   . Hypertension Father   . Kidney disease Father   . Cancer Father        Hx of colon cancer  . Arthritis Sister   . Heart attack Maternal Grandmother   . Diabetes Maternal Grandfather   . Stroke Paternal Grandmother   . Arthritis Sister   . Breast cancer Neg Hx     SOCIAL HISTORY: Social History   Tobacco Use  . Smoking status: Former Smoker    Quit date: 11/29/2013    Years since quitting: 5.7  . Smokeless tobacco: Never Used  Substance Use Topics  . Alcohol  use: Yes    Alcohol/week: 20.0 standard drinks    Types: 20 Glasses of wine per week    Allergies  Allergen Reactions  . Demerol [Meperidine Hcl]   . Iodine   . Keflex [Cephalexin]   . Macrobid [Nitrofurantoin Macrocrystal]   . Mevacor [Lovastatin]   . Morphine And Related   . Sulfa Antibiotics   . Terramycin [Oxytetracycline]   . Vicodin [Hydrocodone-Acetaminophen]     Current Outpatient Medications  Medication Sig Dispense Refill  . acetaminophen (TYLENOL) 500 MG tablet Take 500 mg by mouth every 6 (six) hours as needed.    . calcium carbonate (OS-CAL - DOSED IN MG OF ELEMENTAL CALCIUM) 1250 (500 Ca) MG tablet Take 1 tablet by mouth.    . Cholecalciferol (VITAMIN D3) 25 MCG (1000 UT) CAPS Take by mouth.    . cyanocobalamin 1000 MCG tablet Take by mouth daily.    Marland Kitchen glucosamine-chondroitin 500-400 MG tablet Take 1 tablet by mouth 3 (three) times daily.    Marland Kitchen losartan-hydrochlorothiazide (HYZAAR) 100-25 MG tablet TAKE 1 TABLET BY MOUTH EVERY DAY 90 tablet 3  . Multiple Vitamin (MULTIVITAMIN) tablet Take 1 tablet by mouth daily.    . pravastatin (PRAVACHOL) 10 MG tablet TAKE 1 TABLET BY  MOUTH EVERYDAY AT BEDTIME 90 tablet 3  . SYNTHROID 112 MCG tablet Take 1 tablet (112 mcg total) by mouth daily before breakfast. 90 tablet 3  . temazepam (RESTORIL) 15 MG capsule TAKE 1 CAPSULE BY MOUTH AT BEDTIME AS NEEDED FOR SLEEP. 30 capsule 3  . triamcinolone cream (KENALOG) 0.1 % APPLY TO AFFECTED AREA TWICE A DAY 30 g 0   No current facility-administered medications for this visit.     REVIEW OF SYSTEMS:  [X]  denotes positive finding, [ ]  denotes negative finding Cardiac  Comments:  Chest pain or chest pressure:    Shortness of breath upon exertion:    Short of breath when lying flat:    Irregular heart rhythm:        Vascular    Pain in calf, thigh, or hip brought on by ambulation:    Pain in feet at night that wakes you up from your sleep:     Blood clot in your veins:    Leg  swelling:  x       Pulmonary    Oxygen at home:    Productive cough:     Wheezing:         Neurologic    Sudden weakness in arms or legs:     Sudden numbness in arms or legs:     Sudden onset of difficulty speaking or slurred speech:    Temporary loss of vision in one eye:     Problems with dizziness:         Gastrointestinal    Blood in stool:     Vomited blood:         Genitourinary    Burning when urinating:     Blood in urine:        Psychiatric    Major depression:         Hematologic    Bleeding problems:    Problems with blood clotting too easily:        Skin    Rashes or ulcers:        Constitutional    Fever or chills:     PHYSICAL EXAM:   Vitals:   08/16/19 1236  BP: 121/77  Pulse: 95  Resp: 18  Temp: (!) 97.1 F (36.2 C)  TempSrc: Temporal  SpO2: 95%  Weight: 185 lb 1.6 oz (84 kg)  Height: 5\' 3"  (1.6 m)    GENERAL: The patient is a well-nourished female, in no acute distress. The vital signs are documented above. CARDIAC: There is a regular rate and rhythm.  VASCULAR: I do not detect carotid bruits. She has palpable dorsalis pedis pulses bilaterally. She has large varicose veins which extend from the medial aspect of her left thigh down to her medial left calf and onto her pretibial area as documented below.  These veins are under significant pressure.      I did look at her left great saphenous vein myself with the SonoSite.  In the mid thigh the great saphenous vein is feeding this large cluster of varicose veins.  There is reflux from the saphenofemoral junction to the distal thigh.  The vein is significantly dilated.  Currently she does not have significant left lower extremity swelling.  PULMONARY: There is good air exchange bilaterally without wheezing or rales. ABDOMEN: Soft and non-tender with normal pitched bowl sounds.  MUSCULOSKELETAL: There are no major deformities or cyanosis. NEUROLOGIC: No focal weakness or paresthesias are  detected. SKIN: There are no ulcers or  rashes noted. PSYCHIATRIC: The patient has a normal affect.  DATA:    VENOUS DUPLEX: I have independently interpreted her venous duplex scan of the left lower extremity today.  On the left side, there is no evidence of DVT.  She does have deep venous reflux involving the common femoral vein and femoral vein.  There is superficial venous reflux involving the saphenofemoral junction and the great saphenous vein to the distal thigh.  The vein is dilated with diameters up to 0.53 cm.  MEDICAL ISSUES:   CHRONIC VENOUS INSUFFICIENCY: This patient does have superficial and deep venous reflux on the left.  She has CEAP C2 venous disease.  I have discussed with her the importance of continuing daily leg elevation and we have discussed the proper positioning for this.  In addition she will continue to wear her compression stockings.  I have encouraged her to avoid prolonged sitting and standing.  We discussed the importance of exercise specifically walking and water aerobics.  In addition we discussed the importance of weight management.  If her symptoms progress that I think she would be a candidate for laser ablation of the left great saphenous vein from just above the takeoff of these large varicosities to the saphenofemoral junction.  She potentially had staged stab phlebectomies (greater than 20 stabs).  She will call if her symptoms progress.  A total of 40 minutes was spent on this visit. 20 minutes was face to face time. More than 50% of the time was spent on counseling and coordinating with the patient.   Deitra Mayo [facility] [phone]

## 2019-09-21 ENCOUNTER — Other Ambulatory Visit: Payer: Self-pay

## 2019-09-21 ENCOUNTER — Encounter: Payer: Self-pay | Admitting: Family Medicine

## 2019-09-21 ENCOUNTER — Ambulatory Visit: Payer: Self-pay | Admitting: *Deleted

## 2019-09-21 ENCOUNTER — Ambulatory Visit (INDEPENDENT_AMBULATORY_CARE_PROVIDER_SITE_OTHER): Payer: Medicare Other | Admitting: Family Medicine

## 2019-09-21 VITALS — BP 120/70 | HR 93 | Temp 97.4°F | Resp 18 | Ht 63.0 in | Wt 182.0 lb

## 2019-09-21 DIAGNOSIS — S0083XA Contusion of other part of head, initial encounter: Secondary | ICD-10-CM

## 2019-09-21 DIAGNOSIS — S51819A Laceration without foreign body of unspecified forearm, initial encounter: Secondary | ICD-10-CM | POA: Diagnosis not present

## 2019-09-21 DIAGNOSIS — W19XXXA Unspecified fall, initial encounter: Secondary | ICD-10-CM

## 2019-09-21 NOTE — Patient Instructions (Signed)

## 2019-09-21 NOTE — Telephone Encounter (Signed)
Pt called stating that she fell on 09/20/2019 around 1800; her left eye is purple and swollen; she says that she is having a hard time seeing because her eye is almost swollen shut the pt is not sure how she fell; she says that when she got up her knees buckled; recommendations made per nurse triage protocol; she verbalized understanding; thept sees Dr Nani Ravens, [facility]; pt transferred to [facility] for scheduling. Reason for Disposition . Eyelids swollen shut  Answer Assessment - Initial Assessment Questions 1. MECHANISM: "How did the injury happen?" For falls, ask: "What height did you fall from?" and "What surface did you fall against?"     09/20/2019 around 1800; fell on carpeted flolor 2. ONSET: "When did the injury happen?" (Minutes or hours ago)      pt is not sure, Pt's knees buckled when she stood up 3. NEUROLOGIC SYMPTOMS: "Was there any loss of consciousness?" "Are there any other neurological symptoms?"     no 4. MENTAL STATUS: "Does the person know who he is, who you are, and where he is?"     yes 5. LOCATION: "What part of the head was hit?"      yes 6. SCALP APPEARANCE: "What does the scalp look like? Is it bleeding now?" If so, ask: "Is it difficult to stop?"     no 7. SIZE: For cuts, bruises, or swelling, ask: "How large is it?" (e.g., inches or centimeters)      Left eye swollen and bruised 8. PAIN: "Is there any pain?" If so, ask: "How bad is it?"  (e.g., Scale 1-10; or mild, moderate, severe)     no 9. TETANUS: For any breaks in the skin, ask: "When was the last tetanus booster?"   n/a 10. OTHER SYMPTOMS: "Do you have any other symptoms?" (e.g., neck pain, vomiting)      no 11. PREGNANCY: "Is there any chance you are pregnant?" "When was your last menstrual period?"       no  Answer Assessment - Initial Assessment Questions 1. MECHANISM: "How did the injury happen?"      Pt not sure; says her legs buckled when she stood up 2. ONSET: "When did the injury happen?"  (Minutes or hours ago)      09/20/2019 around 1800 3. LOCATION: "What part of the eye is injured?" (cornea, sclera, eyelid, or periorbital tissue)     Periorbital tissue 4. APPEARANCE: "What does the eye look like?"      Purple and swollen almost shut 5. VISION: "Is the vision blurred?"      no 6. PAIN: "Is it painful?" If so, ask: "How bad is the pain?"   (e.g., Scale 1-10; or mild, moderate, severe)    no 7. SIZE: For cuts, bruises, or swelling, ask: "How large is it?" (e.g., inches or centimeters)      Entire eye 8. TETANUS: For any breaks in the skin, ask: "When was the last tetanus booster?"     n/a 9. CONTACTS: "Do you wear contacts?"    no 10. OTHER SYMPTOMS: "Do you have any other symptoms?" (e.g., headache, neck pain, vomiting)       Sclera reddened 11. PREGNANCY: "Is there any chance you are pregnant?" "When was your last menstrual period?"       no  Protocols used: EYE INJURY-A-AH, HEAD INJURY-A-AH

## 2019-09-21 NOTE — Progress Notes (Signed)
Patient ID: Jacqueline Smith, female    DOB: 1941/04/26  Age: 78 y.o. MRN: [id number]    Subjective:  Subjective  HPI Jacqueline Smith presents for f/u from fall last night.  Her knee may have buckled or her sneaker got caught up on the carpet and she fell face forward.  Her glasses broke and she has a black/ swollen L eye and skin tears on both arms.   Pt husband and daughter were in the next room when this occurred and they came running in.  No LOC Pt denies headache or vision changes.  No dizziness.  No weakness or slurred speech  Review of Systems  Constitutional: Negative for appetite change, diaphoresis, fatigue and unexpected weight change.  Eyes: Negative for pain, redness and visual disturbance.       L black eye and swelling  Respiratory: Negative for cough, chest tightness, shortness of breath and wheezing.   Cardiovascular: Negative for chest pain, palpitations and leg swelling.  Endocrine: Negative for cold intolerance, heat intolerance, polydipsia, polyphagia and polyuria.  Genitourinary: Negative for difficulty urinating, dysuria and frequency.  Skin: Positive for wound.  Neurological: Negative for dizziness, speech difficulty, weakness, light-headedness, numbness and headaches.    History Past Medical History:  Diagnosis Date  . Arthritis   . Chickenpox   . Hyperlipidemia   . Hypertension   . Hypothyroid   . Osteopenia     She has a past surgical history that includes Foot surgery (Right) and Abdominal hysterectomy (2002).   Her family history includes Arthritis in her father, mother, sister, and sister; Cancer in her father; Diabetes in her maternal grandfather; Heart attack in her maternal grandmother and mother; Hyperlipidemia in her mother; Hypertension in her father and mother; Kidney disease in her father; Stroke in her paternal grandmother.She reports that she quit smoking about 5 years ago. She has never used smokeless tobacco. She reports current alcohol  use of about 20.0 standard drinks of alcohol per week. She reports that she does not use drugs.  Current Outpatient Medications on File Prior to Visit  Medication Sig Dispense Refill  . acetaminophen (TYLENOL) 500 MG tablet Take 500 mg by mouth every 6 (six) hours as needed.    . calcium carbonate (OS-CAL - DOSED IN MG OF ELEMENTAL CALCIUM) 1250 (500 Ca) MG tablet Take 1 tablet by mouth.    . Cholecalciferol (VITAMIN D3) 25 MCG (1000 UT) CAPS Take by mouth.    . cyanocobalamin 1000 MCG tablet Take by mouth daily.    Marland Kitchen glucosamine-chondroitin 500-400 MG tablet Take 1 tablet by mouth 3 (three) times daily.    Marland Kitchen losartan-hydrochlorothiazide (HYZAAR) 100-25 MG tablet TAKE 1 TABLET BY MOUTH EVERY DAY 90 tablet 3  . Multiple Vitamin (MULTIVITAMIN) tablet Take 1 tablet by mouth daily.    . pravastatin (PRAVACHOL) 10 MG tablet TAKE 1 TABLET BY MOUTH EVERYDAY AT BEDTIME 90 tablet 3  . SYNTHROID 112 MCG tablet Take 1 tablet (112 mcg total) by mouth daily before breakfast. 90 tablet 3  . temazepam (RESTORIL) 15 MG capsule TAKE 1 CAPSULE BY MOUTH AT BEDTIME AS NEEDED FOR SLEEP. 30 capsule 3  . triamcinolone cream (KENALOG) 0.1 % APPLY TO AFFECTED AREA TWICE A DAY 30 g 0   No current facility-administered medications on file prior to visit.      Objective:  Objective  Physical Exam Vitals signs and nursing note reviewed.  Constitutional:      Appearance: She is well-developed.  HENT:  Head: Normocephalic and atraumatic.  Eyes:     General: Vision grossly intact. Gaze aligned appropriately.        Left eye: No foreign body, discharge or hordeolum.     Conjunctiva/sclera:     Left eye: Left conjunctiva is not injected. No exudate or hemorrhage.    Pupils: Pupils are equal, round, and reactive to light.     Funduscopic exam:    Right eye: No hemorrhage.        Left eye: No hemorrhage.  Neck:     Musculoskeletal: Normal range of motion and neck supple.     Thyroid: No thyromegaly.      Vascular: No carotid bruit or JVD.  Cardiovascular:     Rate and Rhythm: Normal rate and regular rhythm.     Heart sounds: Normal heart sounds. No murmur.  Pulmonary:     Effort: Pulmonary effort is normal. No respiratory distress.     Breath sounds: Normal breath sounds. No wheezing or rales.  Chest:     Chest wall: No tenderness.  Neurological:     Mental Status: She is alert and oriented to person, place, and time.    BP 120/70 (BP Location: Right Arm, Patient Position: Sitting, Cuff Size: Normal)   Pulse 93   Temp (!) 97.4 F (36.3 C) (Temporal)   Resp 18   Ht 5\' 3"  (1.6 m)   Wt 182 lb (82.6 kg)   SpO2 98%   BMI 32.24 kg/m  Wt Readings from Last 3 Encounters:  09/21/19 182 lb (82.6 kg)  08/16/19 185 lb 1.6 oz (84 kg)  07/25/19 187 lb 2 oz (84.9 kg)     Lab Results  Component Value Date   WBC 6.4 03/28/2019   HGB 13.2 03/28/2019   HCT 39.8 03/28/2019   PLT 218.0 03/28/2019   GLUCOSE 94 03/28/2019   CHOL 179 03/28/2019   TRIG 90.0 03/28/2019   HDL 74.00 03/28/2019   LDLCALC 87 03/28/2019   ALT 15 07/25/2019   AST 19 07/25/2019   NA 141 03/28/2019   K 4.1 03/28/2019   CL 105 03/28/2019   CREATININE 0.61 03/28/2019   BUN 16 03/28/2019   CO2 29 03/28/2019   TSH 0.20 (L) 07/25/2019   HGBA1C 5.6 03/28/2019    Vas [place] Lower Extremity Venous Reflux  Result Date: 08/16/2019  Lower Venous Reflux Study Indications: Varicosities involving the left lower extremity  Performing Technologist: Ronal Fear RVS, RCS  Examination Guidelines: A complete evaluation includes B-mode imaging, spectral Doppler, color Doppler, and power Doppler as needed of all accessible portions of each vessel. Bilateral testing is considered an integral part of a complete examination. Limited examinations for reoccurring indications may be performed as noted. The reflux portion of the exam is performed with the patient in reverse Trendelenburg.    +--------------+------+---------+--------+--------+ LEFT          RefluxReflux NoDiameterComments                Yes                            +--------------+------+---------+--------+--------+ CFV            yes                   >1000 ms +--------------+------+---------+--------+--------+ FV mid         yes                   >  1000 ms +--------------+------+---------+--------+--------+ Popliteal              no                     +--------------+------+---------+--------+--------+ GSV at [facility]     yes           0.84 cm >500 ms  +--------------+------+---------+--------+--------+ GSV prox thigh yes           0.49 cm >500 ms  +--------------+------+---------+--------+--------+ GSV mid thigh  yes           0.53 cm >500 ms  +--------------+------+---------+--------+--------+ GSV dist thigh yes           0.53 cm >500 ms  +--------------+------+---------+--------+--------+ GSV at knee            no    0.32 cm          +--------------+------+---------+--------+--------+ GSV prox calf  yes           0.29 cm >500 ms  +--------------+------+---------+--------+--------+ SSV prox calf          no    0.21 cm          +--------------+------+---------+--------+--------+   Summary: Left: 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 Superficial reflux >500 ms involving the great saphenous vein at the saphenofemoral junction, proximal, mid and distal thigh and the proximal calf. No evidence of deep vein thrombosis. No evidence of superficial vein thrombosis.  *See table(s) above for measurements and observations. Electronically signed by Deitra Mayo MD on 08/16/2019 at 12:36:16 PM.    Final      Assessment & Plan:  Plan  I am having Tyler Deis maintain her glucosamine-chondroitin, cyanocobalamin, triamcinolone cream, Synthroid, pravastatin, losartan-hydrochlorothiazide, acetaminophen, temazepam,  multivitamin, Vitamin D3, and calcium carbonate.  No orders of the defined types were placed in this encounter.   Problem List Items Addressed This Visit      Unprioritized   Facial hematoma    Ice for next 24-48 hours then heat  If she starts to have headaches she will call the office       Fall - Primary    Pt really thinks her shoes were the issue She will try to pay more attention Second fall since July       Skin tear of forearm without complication, initial encounter    Areas cleaned and abx ointment with sterile dressing placed Call or rto prn  Tetanus utd         Follow-up: Return if symptoms worsen or fail to improve.  Ann Held, DO

## 2019-09-21 NOTE — Assessment & Plan Note (Addendum)
Areas cleaned and abx ointment with sterile dressing placed Call or rto prn  Tetanus utd

## 2019-09-21 NOTE — Telephone Encounter (Signed)
Appt scheduled

## 2019-09-21 NOTE — Assessment & Plan Note (Signed)
Pt really thinks her shoes were the issue She will try to pay more attention Second fall since July

## 2019-09-21 NOTE — Assessment & Plan Note (Signed)
Ice for next 24-48 hours then heat  If she starts to have headaches she will call the office

## 2019-09-28 ENCOUNTER — Ambulatory Visit: Payer: Medicare Other | Admitting: Family Medicine

## 2019-10-01 DIAGNOSIS — M79671 Pain in right foot: Secondary | ICD-10-CM | POA: Diagnosis not present

## 2019-10-01 DIAGNOSIS — M79672 Pain in left foot: Secondary | ICD-10-CM | POA: Diagnosis not present

## 2019-10-01 DIAGNOSIS — B351 Tinea unguium: Secondary | ICD-10-CM | POA: Diagnosis not present

## 2019-10-03 ENCOUNTER — Ambulatory Visit: Payer: Medicare Other | Admitting: Family Medicine

## 2019-10-08 ENCOUNTER — Other Ambulatory Visit: Payer: Self-pay

## 2019-10-09 ENCOUNTER — Other Ambulatory Visit: Payer: Self-pay

## 2019-10-09 ENCOUNTER — Ambulatory Visit (INDEPENDENT_AMBULATORY_CARE_PROVIDER_SITE_OTHER): Payer: Medicare Other | Admitting: Family Medicine

## 2019-10-09 ENCOUNTER — Encounter: Payer: Self-pay | Admitting: Family Medicine

## 2019-10-09 VITALS — BP 132/80 | HR 82 | Temp 96.8°F | Ht 64.0 in | Wt 183.2 lb

## 2019-10-09 DIAGNOSIS — E039 Hypothyroidism, unspecified: Secondary | ICD-10-CM

## 2019-10-09 DIAGNOSIS — I10 Essential (primary) hypertension: Secondary | ICD-10-CM

## 2019-10-09 DIAGNOSIS — G4700 Insomnia, unspecified: Secondary | ICD-10-CM

## 2019-10-09 NOTE — Patient Instructions (Addendum)
Keep the diet clean and stay active.  Let me know if you would like to see a neuropsychologist regarding memory issues. I do not think this is dementia and is more related to stress.   Let me know if/when you need refills.  Let us know if you need anything.

## 2019-10-09 NOTE — Progress Notes (Signed)
Chief Complaint  Patient presents with  . Follow-up    Subjective: Patient is a 78 y.o. female here for f/u.  Hypothyroidism Patient presents for follow-up of hypothyroidism.  Reports compliance with medication- Synthroid 112 mcg/d. Current symptoms include: denies fatigue, weight changes, heat/cold intolerance, bowel/skin changes or CVS symptoms She believes her dose should be unchanged  Hypertension Patient presents for hypertension follow up. She does not monitor home blood pressures. She is compliant with medications- Hyzaar 100-25 mg/d. Patient has these side effects of medication: none She is usually adhering to a healthy diet overall. Exercise: walking   Insomnia Intermittent insomnia. Takes Restoril 15 mg at bedtime prn. Usually around 7-8 times per mo. No AE's. Helps her fall asleep, not always stay asleep.   ROS: Heart: Denies chest pain  Lungs: Denies SOB   Past Medical History:  Diagnosis Date  . Arthritis   . Chickenpox   . Hyperlipidemia   . Hypertension   . Hypothyroid   . Osteopenia     Objective: BP 132/80 (BP Location: Left Arm, Patient Position: Sitting, Cuff Size: Normal)   Pulse 82   Temp (!) 96.8 F (36 C) (Temporal)   Ht 5\' 4"  (1.626 m)   Wt 183 lb 4 oz (83.1 kg)   SpO2 94%   BMI 31.45 kg/m  General: Awake, appears stated age HEENT: MMM, EOMi Heart: RRR, no murmurs Lungs: CTAB, no rales, wheezes or rhonchi. No accessory muscle use Psych: Age appropriate judgment and insight, normal affect and mood  Assessment and Plan: Hypothyroidism, unspecified type  Essential hypertension  Insomnia, unspecified type  1- cont thyroid med 2- Cont Hyzaar. Counseled on diet and exercise. 3- Cont Restoril. CSC updated.  F/u in 6 mo or prn.  The patient voiced understanding and agreement to the plan.  [place], DO 10/09/19  2:18 PM

## 2019-10-30 ENCOUNTER — Other Ambulatory Visit: Payer: Self-pay | Admitting: Family Medicine

## 2019-10-30 MED ORDER — SYNTHROID 112 MCG PO TABS: 112.0000 ug | ORAL_TABLET | Freq: Every day | ORAL | 3 refills | Status: DC

## 2019-10-30 NOTE — Telephone Encounter (Signed)
Faxed synthroid to Pharmacy in [place] [phone] [facility] per patient

## 2019-11-13 ENCOUNTER — Encounter: Payer: Self-pay | Admitting: Family Medicine

## 2019-11-30 ENCOUNTER — Encounter: Payer: Self-pay | Admitting: Family Medicine

## 2019-11-30 ENCOUNTER — Telehealth: Payer: Self-pay | Admitting: Family Medicine

## 2019-11-30 NOTE — Telephone Encounter (Signed)
Medication: pravastatin (PRAVACHOL) 10 MG tablet  losartan-hydrochlorothiazide (HYZAAR) 100-25 MG tablet    Has the patient contacted their pharmacy? No. (If no, request that the patient contact the pharmacy for the refill.) (If yes, when and what did the pharmacy advise?)  Preferred Pharmacy (with phone number or street name):   [id number] - [address], [place]. [address]  [address] [place] [address]  Phone:  [phone] Fax:  [phone] Agent: Please be advised that RX refills may take up to 3 business days. We ask that you follow-up with your pharmacy.

## 2019-12-02 ENCOUNTER — Other Ambulatory Visit: Payer: Self-pay | Admitting: Family Medicine

## 2019-12-02 DIAGNOSIS — E785 Hyperlipidemia, unspecified: Secondary | ICD-10-CM

## 2019-12-03 DIAGNOSIS — M79671 Pain in right foot: Secondary | ICD-10-CM | POA: Diagnosis not present

## 2019-12-03 DIAGNOSIS — M79672 Pain in left foot: Secondary | ICD-10-CM | POA: Diagnosis not present

## 2019-12-03 DIAGNOSIS — B351 Tinea unguium: Secondary | ICD-10-CM | POA: Diagnosis not present

## 2019-12-03 MED ORDER — LOSARTAN POTASSIUM-HCTZ 100-25 MG PO TABS: 1.0000 | ORAL_TABLET | Freq: Every day | ORAL | 3 refills | Status: DC

## 2019-12-04 ENCOUNTER — Encounter: Payer: Self-pay | Admitting: Family Medicine

## 2019-12-05 ENCOUNTER — Other Ambulatory Visit: Payer: Self-pay | Admitting: Family Medicine

## 2019-12-05 MED ORDER — SYNTHROID 112 MCG PO TABS: 112.0000 ug | ORAL_TABLET | Freq: Every day | ORAL | 0 refills | Status: DC

## 2019-12-05 NOTE — Addendum Note (Signed)
Addended by: Sharon Seller B on: 12/05/2019 10:17 AM   Modules accepted: Orders

## 2019-12-15 DIAGNOSIS — Z23 Encounter for immunization: Secondary | ICD-10-CM | POA: Diagnosis not present

## 2020-01-05 DIAGNOSIS — Z23 Encounter for immunization: Secondary | ICD-10-CM | POA: Diagnosis not present

## 2020-01-17 ENCOUNTER — Encounter: Payer: Self-pay | Admitting: Vascular Surgery

## 2020-01-17 ENCOUNTER — Telehealth: Payer: Self-pay | Admitting: Gastroenterology

## 2020-01-17 ENCOUNTER — Ambulatory Visit (INDEPENDENT_AMBULATORY_CARE_PROVIDER_SITE_OTHER): Payer: Medicare Other | Admitting: Vascular Surgery

## 2020-01-17 ENCOUNTER — Other Ambulatory Visit: Payer: Self-pay

## 2020-01-17 VITALS — BP 108/71 | HR 74 | Temp 97.6°F | Resp 18 | Ht 64.0 in | Wt 189.0 lb

## 2020-01-17 DIAGNOSIS — I83812 Varicose veins of left lower extremities with pain: Secondary | ICD-10-CM | POA: Diagnosis not present

## 2020-01-17 NOTE — Progress Notes (Signed)
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 female who I saw last on 08/16/2019.  She had painful varicose veins of her left lower extremity.  She had been wearing thigh-high compression stockings with a gradient of 20 to 30 mmHg, elevating her legs, and taking ibuprofen as needed for pain.  On exam she had some markedly dilated varicose veins as documented in the photographs in her last office visit.  These involved her medial calf and medial thigh, and also her anterior left leg.  I did look at the saphenous vein myself with the SonoSite and she had reflux in her great saphenous vein and this was feeding this large cluster of varicose veins.  I looked at the great saphenous vein myself with the SonoSite and felt that we could cannulate the vein below the takeoff of these large tributaries.  In addition she would require greater than 20 stab phlebectomies.  She comes in for routine follow-up visit.  She comes in today because she developed some aching pain along the medial aspect of her left leg and wanted to have her veins checked.  She does describe some aching and heaviness when she stands for prolonged period of time but her symptoms have been quite tolerable.  She does wear her thigh-high compression stockings and elevates her legs.  She tries to avoid ibuprofen if possible for fear that this will upset her stomach.  She denies any significant leg swelling.  Current Outpatient Medications  Medication Sig Dispense Refill  . acetaminophen (TYLENOL) 500 MG tablet Take 500 mg by mouth every 6 (six) hours as needed.    . calcium carbonate (OS-CAL - DOSED IN MG OF ELEMENTAL CALCIUM) 1250 (500 Ca) MG tablet Take 1 tablet by mouth.    . Cholecalciferol (VITAMIN D3) 25 MCG (1000 UT) CAPS Take by mouth.    . cyanocobalamin 1000 MCG tablet  Take by mouth daily.    Marland Kitchen glucosamine-chondroitin 500-400 MG tablet Take 1 tablet by mouth 3 (three) times daily.    Marland Kitchen losartan-hydrochlorothiazide (HYZAAR) 100-25 MG tablet Take 1 tablet by mouth daily. 90 tablet 3  . Multiple Vitamin (MULTIVITAMIN) tablet Take 1 tablet by mouth daily.    . pravastatin (PRAVACHOL) 10 MG tablet TAKE 1 TABLET BY MOUTH EVERYDAY AT BEDTIME 90 tablet 3  . SYNTHROID 112 MCG tablet Take 1 tablet (112 mcg total) by mouth daily before breakfast. 90 tablet 0  . temazepam (RESTORIL) 15 MG capsule TAKE 1 CAPSULE BY MOUTH AT BEDTIME AS NEEDED FOR SLEEP. 30 capsule 3  . triamcinolone cream (KENALOG) 0.1 % APPLY TO AFFECTED AREA TWICE A DAY 30 g 0   No current facility-administered medications for this visit.    REVIEW OF SYSTEMS:  [X]  denotes positive finding, [ ]  denotes negative finding Vascular    Leg swelling    Cardiac    Chest pain or chest pressure:    Shortness of breath upon exertion:    Short of breath when lying flat:    Irregular heart rhythm:    Constitutional    Fever or chills:     PHYSICAL EXAM:   Vitals:   01/17/20 1403  BP: 108/71  Pulse: 74  Resp: 18  Temp: 97.6 F (36.4 C)  TempSrc: Temporal  SpO2: 97%  Weight: 189 lb (85.7 kg)  Height: 5\' 4"  (1.626 m)    GENERAL: The patient is a well-nourished female, in no acute distress. The vital signs are documented above. CARDIOVASCULAR: There is a regular rate and rhythm. PULMONARY: There is good air exchange bilaterally without wheezing or rales. VASCULAR: She has no significant leg swelling. The varicose veins in her medial left thigh, medial left calf, and anterior left leg have not changed since I saw her last.  There is no evidence of phlebitis on exam.  I looked at her veins with the duplex scanner and there is no evidence of phlebitis by duplex.  She does have reflux in the great saphenous vein.  DATA:   No new data  MEDICAL ISSUES:   PAINFUL VARICOSE VEINS LEFT LOWER  EXTREMITY: The patient was having some pain overlying her varicose veins in the distal medial left thigh.  I reassured her that there is no evidence of phlebitis by exam or by duplex.  I encouraged her to continue to wear her thigh-high compression stockings and elevate her legs daily.  If her symptoms progress I think she would be a candidate for laser ablation of the left great saphenous vein with greater than 20 stab phlebectomies.  She will call if her symptoms progress.  Deitra Mayo [facility] [phone]

## 2020-01-17 NOTE — Telephone Encounter (Signed)
Patient has moved here from [place] and states that she is due for another colonoscopy. Patient is requesting to see Dr. Bryan Lemma. Colon/path report placed on desk for review.

## 2020-01-22 ENCOUNTER — Ambulatory Visit: Payer: Medicare Other | Admitting: Family Medicine

## 2020-01-25 NOTE — Telephone Encounter (Signed)
Previous colonoscopy report from [facility] received as outlined below:  -Colonoscopy (08/13/2015, Dulce Sellar. Trixie Rude, MD; indication: CRC screening, personal history of colon polyps, family history of colon cancer in father): Flat polyp 7-8 mm in sigmoid colon at 20 cm (path [facility]), flat polyp 5-6 mm in distal rectum (path [facility]), diverticulosis in the ascending colon and sigmoid colon.  Excellent bowel prep.  Given family history of colon cancer (father) and prior personal history of polyps, along with previous Endoscopist's recommendation of 5-year interval, recommend repeat colonoscopy at this time.  If she prefers an office appointment first to discuss, I would be happy to see her in the [facility] clinic anytime.

## 2020-01-28 NOTE — Telephone Encounter (Signed)
Spoke to patient and informed her of what Dr. Bryan Lemma states. Patient says that she is wanting to contact Medicare for scheduling and will call our office after she speaks with Medicare.

## 2020-01-29 ENCOUNTER — Other Ambulatory Visit: Payer: Self-pay | Admitting: Family Medicine

## 2020-01-29 DIAGNOSIS — M1990 Unspecified osteoarthritis, unspecified site: Secondary | ICD-10-CM

## 2020-01-31 NOTE — Telephone Encounter (Signed)
Called and LM to call back to schedule colonoscopy.

## 2020-02-01 NOTE — Telephone Encounter (Signed)
Pt called stating that she is not having any GI sxs and will call back in July to schedule procedure.

## 2020-02-06 DIAGNOSIS — M79671 Pain in right foot: Secondary | ICD-10-CM | POA: Diagnosis not present

## 2020-02-06 DIAGNOSIS — B351 Tinea unguium: Secondary | ICD-10-CM | POA: Diagnosis not present

## 2020-02-06 DIAGNOSIS — M79672 Pain in left foot: Secondary | ICD-10-CM | POA: Diagnosis not present

## 2020-02-11 DIAGNOSIS — F5102 Adjustment insomnia: Secondary | ICD-10-CM

## 2020-02-11 MED ORDER — TEMAZEPAM 15 MG PO CAPS: ORAL_CAPSULE | ORAL | 5 refills | Status: DC

## 2020-03-01 ENCOUNTER — Other Ambulatory Visit: Payer: Self-pay | Admitting: Family Medicine

## 2020-03-04 DIAGNOSIS — M170 Bilateral primary osteoarthritis of knee: Secondary | ICD-10-CM | POA: Insufficient documentation

## 2020-03-04 DIAGNOSIS — M2550 Pain in unspecified joint: Secondary | ICD-10-CM | POA: Insufficient documentation

## 2020-03-07 ENCOUNTER — Other Ambulatory Visit: Payer: Self-pay

## 2020-04-08 ENCOUNTER — Other Ambulatory Visit: Payer: Self-pay

## 2020-04-08 ENCOUNTER — Ambulatory Visit (INDEPENDENT_AMBULATORY_CARE_PROVIDER_SITE_OTHER): Payer: Medicare Other | Admitting: Family Medicine

## 2020-04-08 ENCOUNTER — Encounter: Payer: Self-pay | Admitting: Family Medicine

## 2020-04-08 VITALS — BP 126/78 | HR 72 | Temp 97.8°F | Resp 12 | Ht 64.0 in | Wt 192.0 lb

## 2020-04-08 DIAGNOSIS — R5383 Other fatigue: Secondary | ICD-10-CM | POA: Diagnosis not present

## 2020-04-08 DIAGNOSIS — I10 Essential (primary) hypertension: Secondary | ICD-10-CM

## 2020-04-08 DIAGNOSIS — E039 Hypothyroidism, unspecified: Secondary | ICD-10-CM | POA: Diagnosis not present

## 2020-04-08 DIAGNOSIS — E785 Hyperlipidemia, unspecified: Secondary | ICD-10-CM | POA: Diagnosis not present

## 2020-04-08 DIAGNOSIS — R7303 Prediabetes: Secondary | ICD-10-CM | POA: Diagnosis not present

## 2020-04-08 DIAGNOSIS — Z8542 Personal history of malignant neoplasm of other parts of uterus: Secondary | ICD-10-CM

## 2020-04-08 LAB — T4, FREE: Free T4: 1.33 ng/dL (ref 0.60–1.60)

## 2020-04-08 LAB — COMPREHENSIVE METABOLIC PANEL
ALT: 15 U/L (ref 0–35)
AST: 18 U/L (ref 0–37)
Albumin: 4.4 g/dL (ref 3.5–5.2)
Alkaline Phosphatase: 33 U/L — ABNORMAL LOW (ref 39–117)
BUN: 13 mg/dL (ref 6–23)
CO2: 32 mEq/L (ref 19–32)
Calcium: 9.6 mg/dL (ref 8.4–10.5)
Chloride: 101 mEq/L (ref 96–112)
Creatinine, Ser: 0.61 mg/dL (ref 0.40–1.20)
GFR: 94.43 mL/min (ref >60.00)
Glucose, Bld: 105 mg/dL — ABNORMAL HIGH (ref 70–99)
Potassium: 4.6 mEq/L (ref 3.5–5.1)
Sodium: 138 mEq/L (ref 135–145)
Total Bilirubin: 0.9 mg/dL (ref 0.2–1.2)
Total Protein: 6.6 g/dL (ref 6.0–8.3)

## 2020-04-08 LAB — LIPID PANEL
Cholesterol: 211 mg/dL — ABNORMAL HIGH (ref 0–200)
HDL: 76.1 mg/dL (ref >39.00)
LDL Cholesterol: 115 mg/dL — ABNORMAL HIGH (ref 0–99)
NonHDL: 134.9
Total CHOL/HDL Ratio: 3
Triglycerides: 101 mg/dL (ref 0.0–149.0)
VLDL: 20.2 mg/dL (ref 0.0–40.0)

## 2020-04-08 LAB — CBC
HCT: 40.4 % (ref 36.0–46.0)
Hemoglobin: 13.7 g/dL (ref 12.0–15.0)
MCHC: 33.8 g/dL (ref 30.0–36.0)
MCV: 99.1 fl (ref 78.0–100.0)
Platelets: 255 10*3/uL (ref 150.0–400.0)
RBC: 4.08 Mil/uL (ref 3.87–5.11)
RDW: 14.4 % (ref 11.5–15.5)
WBC: 5.7 10*3/uL (ref 4.0–10.5)

## 2020-04-08 LAB — HEMOGLOBIN A1C: Hgb A1c MFr Bld: 5.5 % (ref 4.6–6.5)

## 2020-04-08 LAB — TSH: TSH: 1.74 u[IU]/mL (ref 0.35–4.50)

## 2020-04-08 NOTE — Progress Notes (Signed)
Chief Complaint  Patient presents with  . 6 month follow up    Subjective Jacqueline Smith is a 79 y.o. female who presents for hypertension follow up. She does not monitor home blood pressures. She is compliant with medications. Patient has these side effects of medication: none She is usually adhering to a healthy diet overall. Current exercise: walking, though is not as active as she used to be  Hyperlipidemia Patient presents for dyslipidemia follow up. Currently being treated with pravastatin 10 mg/d and compliance with treatment thus far has been good. She denies myalgias. The patient is not known to have coexisting coronary artery disease.  Hypothyroidism Patient presents for follow-up of hypothyroidism.  Reports compliance with medication. Current symptoms include: Fatigue Denies weight changes, heat/cold intolerance, bowel/skin changes or CVS symptoms She believes her dose should be unchanged   Past Medical History:  Diagnosis Date  . Arthritis   . Chickenpox   . History of uterine cancer   . Hyperlipidemia   . Hypertension   . Hypothyroid   . Osteopenia     Exam BP 126/78 (BP Location: Left Arm, Cuff Size: Normal)   Pulse 72   Temp 97.8 F (36.6 C) (Temporal)   Resp 12   Ht 5\' 4"  (1.626 m)   Wt 192 lb (87.1 kg)   SpO2 98%   BMI 32.96 kg/m  General:  well developed, well nourished, in no apparent distress Heart: RRR, no bruits, no LE edema Lungs: clear to auscultation, no accessory muscle use Psych: well oriented with normal range of affect and appropriate judgment/insight  Essential hypertension - Plan: Comprehensive metabolic panel  Hyperlipidemia, unspecified hyperlipidemia type - Plan: Comprehensive metabolic panel, Lipid panel  Hypothyroidism, unspecified type - Plan: TSH, T4, free  Prediabetes - Plan: Hemoglobin A1c  Fatigue, unspecified type - Plan: CBC  History of uterine cancer  Ck above. Counseled on diet and exercise. I think  her fatigue is likely related to aging and decreased physical activity.  F/u in 6 mo or prn. The patient voiced understanding and agreement to the plan.  [place], DO 04/08/20  12:09 PM

## 2020-04-08 NOTE — Patient Instructions (Addendum)
Give us 2-3 business days to get the results of your labs back.   Keep the diet clean and stay active.  Stay hydrated.   Let us know if you need anything.  

## 2020-04-14 ENCOUNTER — Ambulatory Visit (AMBULATORY_SURGERY_CENTER): Payer: Self-pay | Admitting: *Deleted

## 2020-04-14 ENCOUNTER — Other Ambulatory Visit: Payer: Self-pay

## 2020-04-14 VITALS — Ht 65.0 in | Wt 190.0 lb

## 2020-04-14 DIAGNOSIS — Z800 Family history of malignant neoplasm of digestive organs: Secondary | ICD-10-CM

## 2020-04-14 DIAGNOSIS — Z86010 Personal history of colonic polyps: Secondary | ICD-10-CM

## 2020-04-14 NOTE — Progress Notes (Signed)

## 2020-04-29 ENCOUNTER — Encounter: Payer: Medicare Other | Admitting: Gastroenterology

## 2020-04-30 ENCOUNTER — Telehealth: Payer: Self-pay | Admitting: Gastroenterology

## 2020-04-30 NOTE — Telephone Encounter (Signed)
Spoke with the patient and answered all her prep questions.  Pt verbalizes  Understanding and will proceed as scheduled.

## 2020-04-30 NOTE — Telephone Encounter (Signed)
Patient has questions about prep. Please call her.

## 2020-05-02 NOTE — Telephone Encounter (Signed)
Pt is requesting a call back with more questions about her Prep.

## 2020-05-02 NOTE — Telephone Encounter (Signed)
Spoke to patient. All questions answered regarding her colon prep.

## 2020-05-07 ENCOUNTER — Encounter: Payer: Self-pay | Admitting: Gastroenterology

## 2020-05-07 ENCOUNTER — Ambulatory Visit (AMBULATORY_SURGERY_CENTER): Payer: Medicare Other | Admitting: Gastroenterology

## 2020-05-07 VITALS — BP 137/77 | HR 63 | Temp 96.8°F | Resp 13 | Ht 64.0 in | Wt 190.0 lb

## 2020-05-07 DIAGNOSIS — D123 Benign neoplasm of transverse colon: Secondary | ICD-10-CM | POA: Diagnosis not present

## 2020-05-07 DIAGNOSIS — Z1211 Encounter for screening for malignant neoplasm of colon: Secondary | ICD-10-CM | POA: Diagnosis not present

## 2020-05-07 DIAGNOSIS — D120 Benign neoplasm of cecum: Secondary | ICD-10-CM

## 2020-05-07 DIAGNOSIS — Z86010 Personal history of colonic polyps: Secondary | ICD-10-CM | POA: Diagnosis not present

## 2020-05-07 DIAGNOSIS — K621 Rectal polyp: Secondary | ICD-10-CM | POA: Diagnosis not present

## 2020-05-07 DIAGNOSIS — K5730 Diverticulosis of large intestine without perforation or abscess without bleeding: Secondary | ICD-10-CM

## 2020-05-07 DIAGNOSIS — Z800 Family history of malignant neoplasm of digestive organs: Secondary | ICD-10-CM | POA: Diagnosis not present

## 2020-05-07 DIAGNOSIS — K640 First degree hemorrhoids: Secondary | ICD-10-CM | POA: Diagnosis not present

## 2020-05-07 DIAGNOSIS — D128 Benign neoplasm of rectum: Secondary | ICD-10-CM | POA: Diagnosis not present

## 2020-05-07 MED ORDER — SODIUM CHLORIDE 0.9 % IV SOLN: 500.0000 mL | Freq: Once | INTRAVENOUS | Status: AC

## 2020-05-07 NOTE — Progress Notes (Signed)
pt tolerated well. VSS. awake and to recovery. Report given to RN.  

## 2020-05-07 NOTE — Progress Notes (Signed)
Called to room to assist during endoscopic procedure.  Patient ID and intended procedure confirmed with present staff. Received instructions for my participation in the procedure from the performing physician.  

## 2020-05-07 NOTE — Op Note (Signed)
[place] Patient Name: Jacqueline Smith Procedure Date: 05/07/2020 2:12 PM MRN: [id number] Endoscopist: Gerrit Heck , MD Age: 79 Referring MD:  Date of Birth: 03/31/1941 Gender: Female Account #: [id number] Procedure:                Colonoscopy Indications:              Screening in patient at increased risk: Colorectal                            cancer in father around age 7, High risk colon                            cancer surveillance: Personal history of colonic                            polyps                           Last colonoscopy was at outside facility in 07/2015                            and notable for 2 hyperplastic polyps, with                            recommendation to repeat in 5 years due to family                            history. Medicines:                Monitored Anesthesia Care Procedure:                Pre-Anesthesia Assessment:                           - Prior to the procedure, a History and Physical                            was performed, and patient medications and                            allergies were reviewed. The patient's tolerance of                            previous anesthesia was also reviewed. The risks                            and benefits of the procedure and the sedation                            options and risks were discussed with the patient.                            All questions were answered, and informed consent  was obtained. Prior Anticoagulants: The patient has                            taken no previous anticoagulant or antiplatelet                            agents. ASA Grade Assessment: III - A patient with                            severe systemic disease. After reviewing the risks                            and benefits, the patient was deemed in                            satisfactory condition to undergo the procedure.                           After obtaining  informed consent, the colonoscope                            was passed under direct vision. Throughout the                            procedure, the patient's blood pressure, pulse, and                            oxygen saturations were monitored continuously. The                            Colonoscope was introduced through the anus and                            advanced to the the cecum, identified by                            appendiceal orifice and ileocecal valve. The                            colonoscopy was performed without difficulty. The                            patient tolerated the procedure well. The quality                            of the bowel preparation was adequate. The                            ileocecal valve, appendiceal orifice, and rectum                            were photographed. Scope In: 2:29:17 PM Scope Out: 3:14:55 PM Scope Withdrawal Time: 0 hours 41 minutes 52 seconds  Total Procedure Duration: 0 hours 45 minutes  38 seconds  Findings:                 The perianal and digital rectal examinations were                            normal.                           Two sessile polyps were found in the transverse                            colon and cecum. The polyps were 3 to 5 mm in size.                            These polyps were removed with a cold snare.                            Resection and retrieval were complete. Estimated                            blood loss was minimal.                           A 20 mm polyp was found in the transverse colon.                            The polyp was flat. First, the tip of the snare was                            used to mark the outer edges of the polyp with                            cautery, marking approximately 2 mm away from the                            edges. The polyp was removed with a saline                            injection-lift technique using a hot snare.                             Resection and retrieval were complete in piecemeal                            fashion. The edges of the polyp were then removed                            with cold forceps via avulsion technique. To                            prevent bleeding and close the mucosal defect after  the polypectomy, three hemostatic clips were                            successfully placed (MR conditional). One fell off                            shortly after placement, but no residual bleeding.                            There was no bleeding at the end of the procedure.                            Area 3 cm distal to the polypectomy site was                            tattooed with an injection of 2 mL of [place] ink.                           Multiple sessile polyps were found in the rectum.                            The polyps were 2 to 3 mm in size. Several of these                            polyps were removed with a cold biopsy forceps for                            histologic representative evaluation. Resection and                            retrieval were complete. Estimated blood loss was                            minimal.                           Multiple small-mouthed diverticula were found in                            the sigmoid colon.                           Non-bleeding internal hemorrhoids were found during                            retroflexion. The hemorrhoids were small. Complications:            No immediate complications. Estimated Blood Loss:     Estimated blood loss was minimal. Impression:               - Two 3 to 5 mm polyps in the transverse colon and                            in the cecum, removed with a  cold snare. Resected                            and retrieved.                           - One 20 mm polyp in the transverse colon, removed                            using injection-lift and a hot snare. Resected and                             retrieved. Biopsied. Clips (MR conditional) were                            placed. Tattooed.                           - Multiple 2 to 3 mm polyps in the rectum, removed                            with a cold biopsy forceps. Resected and retrieved.                           - Diverticulosis in the sigmoid colon.                           - Non-bleeding internal hemorrhoids. Recommendation:           - Patient has a contact number available for                            emergencies. The signs and symptoms of potential                            delayed complications were discussed with the                            patient. Return to normal activities tomorrow.                            Written discharge instructions were provided to the                            patient.                           - Resume previous diet.                           - Continue present medications.                           - Await pathology results.                           - Repeat colonoscopy  in 6 months for surveillance                            after piecemeal polypectomy. This should be                            scheduled to be done at [facility]                            Endoscopy Unit. Gerrit Heck, MD 05/07/2020 3:35:53 PM

## 2020-05-07 NOTE — Progress Notes (Addendum)
SS - check in [place] - VS  Pt reported her time was changed for her procedure today.  She thinks her last liquids was at 11:30am.  She said I know it has been 3 hours.  Pt then reported that she had memory issues at times. This message was relayed to Randall Hiss, Rn and she relayed it to Earnestine Mealing, CRNA. [email or web address]

## 2020-05-07 NOTE — Patient Instructions (Addendum)
YOU HAD AN ENDOSCOPIC PROCEDURE TODAY AT [facility]:   Refer to the procedure report that was given to you for any specific questions about what was found during the examination.  If the procedure report does not answer your questions, please call your gastroenterologist to clarify.  If you requested that your care partner not be given the details of your procedure findings, then the procedure report has been included in a sealed envelope for you to review at your convenience later.  YOU SHOULD EXPECT: Some feelings of bloating in the abdomen. Passage of more gas than usual.  Walking can help get rid of the air that was put into your GI tract during the procedure and reduce the bloating. If you had a lower endoscopy (such as a colonoscopy or flexible sigmoidoscopy) you may notice spotting of blood in your stool or on the toilet paper. If you underwent a bowel prep for your procedure, you may not have a normal bowel movement for a few days.  Please Note:  You might notice some irritation and congestion in your nose or some drainage.  This is from the oxygen used during your procedure.  There is no need for concern and it should clear up in a day or so.  SYMPTOMS TO REPORT IMMEDIATELY:   Following lower endoscopy (colonoscopy or flexible sigmoidoscopy):  Excessive amounts of blood in the stool  Significant tenderness or worsening of abdominal pains  Swelling of the abdomen that is new, acute  Fever of 100F or higher  For urgent or emergent issues, a gastroenterologist can be reached at any hour by calling [phone]. Do not use MyChart messaging for urgent concerns.    DIET:  We do recommend a small meal at first, but then you may proceed to your regular diet.  Drink plenty of fluids but you should avoid alcoholic beverages for 24 hours.  MEDICATIONS: Continue present medications.  FOLLOW UP: Repeat colonoscopy in 6 months for surveillance after piecemeal polypectomy. This  should be scheduled  To be done at [facility]. Dr. Vivia Ewing office nurse will call you to schedule this appointment.  Please see handouts given to you by your recovery nurse.  Keep "clip" card given to you by your recovery nurse in your wallet to present to medical personnel in the event you were to have an MRI/scan in the near future.  ACTIVITY:  You should plan to take it easy for the rest of today and you should NOT DRIVE or use heavy machinery until tomorrow (because of the sedation medicines used during the test).    FOLLOW UP: Our staff will call the number listed on your records 48-72 hours following your procedure to check on you and address any questions or concerns that you may have regarding the information given to you following your procedure. If we do not reach you, we will leave a message.  We will attempt to reach you two times.  During this call, we will ask if you have developed any symptoms of COVID 19. If you develop any symptoms (ie: fever, flu-like symptoms, shortness of breath, cough etc.) before then, please call [phone].  If you test positive for Covid 19 in the 2 weeks post procedure, please call and report this information to [place].    If any biopsies were taken you will be contacted by phone or by letter within the next 1-3 weeks.  Please call us at [phone] if you have not  heard about the biopsies in 3 weeks.   Thank you for allowing [place] to provide for your healthcare needs today.   SIGNATURES/CONFIDENTIALITY: You and/or your care partner have signed paperwork which will be entered into your electronic medical record.  These signatures attest to the fact that that the information above on your After Visit Summary has been reviewed and is understood.  Full responsibility of the confidentiality of this discharge information lies with you and/or your care-partner.

## 2020-05-09 ENCOUNTER — Telehealth: Payer: Self-pay

## 2020-05-09 ENCOUNTER — Encounter: Payer: Self-pay | Admitting: Gastroenterology

## 2020-05-09 ENCOUNTER — Telehealth: Payer: Self-pay | Admitting: *Deleted

## 2020-05-09 NOTE — Telephone Encounter (Signed)
°  Follow up Call-  Call back number 05/07/2020  Post procedure Call Back phone  # [phone] hm  Permission to leave phone message Yes     Patient questions:  Do you have a fever, pain , or abdominal swelling? No. Pain Score  0 *  Have you tolerated food without any problems? Yes.    Have you been able to return to your normal activities? Yes.    Do you have any questions about your discharge instructions: Diet   No. Medications  No. Follow up visit  No.  Do you have questions or concerns about your Care? No.  Actions: * If pain score is 4 or above: No action needed, pain <4  1. Have you developed a fever since your procedure? NO  2.   Have you had an respiratory symptoms (SOB or cough) since your procedure? NO  3.   Have you tested positive for COVID 19 since your procedure NO  4.   Have you had any family members/close contacts diagnosed with the COVID 19 since your procedure?  NO   If yes to any of these questions please route to Joylene John, RN and Erenest Rasher, RN

## 2020-05-09 NOTE — Telephone Encounter (Signed)
Question answered about 6 month follow up, and clip question.

## 2020-05-13 ENCOUNTER — Emergency Department (HOSPITAL_COMMUNITY): Payer: Medicare Other

## 2020-05-13 ENCOUNTER — Emergency Department (HOSPITAL_COMMUNITY)
Admission: EM | Admit: 2020-05-13 | Discharge: 2020-05-13 | Disposition: A | Discharge status: Home / Self Care | Payer: Medicare Other | Attending: Emergency Medicine | Admitting: Emergency Medicine

## 2020-05-13 DIAGNOSIS — R079 Chest pain, unspecified: Secondary | ICD-10-CM | POA: Insufficient documentation

## 2020-05-13 DIAGNOSIS — Z5321 Procedure and treatment not carried out due to patient leaving prior to being seen by health care provider: Secondary | ICD-10-CM | POA: Insufficient documentation

## 2020-05-13 DIAGNOSIS — I10 Essential (primary) hypertension: Secondary | ICD-10-CM | POA: Diagnosis not present

## 2020-05-13 DIAGNOSIS — R0789 Other chest pain: Secondary | ICD-10-CM | POA: Diagnosis not present

## 2020-05-13 LAB — BASIC METABOLIC PANEL
Anion gap: 8 (ref 5–15)
BUN: 10 mg/dL (ref 8–23)
CO2: 27 mmol/L (ref 22–32)
Calcium: 9.2 mg/dL (ref 8.9–10.3)
Chloride: 102 mmol/L (ref 98–111)
Creatinine, Ser: 0.65 mg/dL (ref 0.44–1.00)
GFR calc Af Amer: >60 mL/min (ref >60)
GFR calc non Af Amer: >60 mL/min (ref >60)
Glucose, Bld: 95 mg/dL (ref 70–99)
Potassium: 4 mmol/L (ref 3.5–5.1)
Sodium: 137 mmol/L (ref 135–145)

## 2020-05-13 LAB — CBC
HCT: 42.2 % (ref 36.0–46.0)
Hemoglobin: 13.7 g/dL (ref 12.0–15.0)
MCH: 32.5 pg (ref 26.0–34.0)
MCHC: 32.5 g/dL (ref 30.0–36.0)
MCV: 100 fL (ref 80.0–100.0)
Platelets: 275 10*3/uL (ref 150–400)
RBC: 4.22 MIL/uL (ref 3.87–5.11)
RDW: 13.3 % (ref 11.5–15.5)
WBC: 6.9 10*3/uL (ref 4.0–10.5)
nRBC: 0 % (ref 0.0–0.2)

## 2020-05-13 LAB — TROPONIN I (HIGH SENSITIVITY): Troponin I (High Sensitivity): 4 ng/L (ref <18)

## 2020-05-13 MED ORDER — SODIUM CHLORIDE 0.9% FLUSH: 3.0000 mL | Freq: Once | INTRAVENOUS | Status: DC

## 2020-05-13 NOTE — ED Triage Notes (Signed)
Pt arrives to ED via [facility] with c/o of CP that resolved by the time ems got to PT. Pt took 324mg  asa at home prior ems arrival. On arrival to ED pt is pain free.

## 2020-05-19 ENCOUNTER — Encounter: Payer: Self-pay | Admitting: Family Medicine

## 2020-05-19 ENCOUNTER — Other Ambulatory Visit: Payer: Self-pay

## 2020-05-19 ENCOUNTER — Telehealth: Payer: Self-pay | Admitting: Family Medicine

## 2020-05-19 ENCOUNTER — Ambulatory Visit (INDEPENDENT_AMBULATORY_CARE_PROVIDER_SITE_OTHER): Payer: Medicare Other | Admitting: Family Medicine

## 2020-05-19 VITALS — BP 142/84 | HR 101 | Temp 98.4°F | Ht 64.0 in | Wt 188.4 lb

## 2020-05-19 DIAGNOSIS — R0789 Other chest pain: Secondary | ICD-10-CM | POA: Diagnosis not present

## 2020-05-19 DIAGNOSIS — K1370 Unspecified lesions of oral mucosa: Secondary | ICD-10-CM

## 2020-05-19 NOTE — Telephone Encounter (Signed)
Caller: Momina Hunton back # [phone]  Patient states after review her AVS she notice a statement "medication you will be given" unsure of what this is, also there are two medications that are the same calcium carbonate..   Patient would like to speak to you.

## 2020-05-19 NOTE — Patient Instructions (Addendum)
There are no supplements I am aware of that are helpful for regaining memory or preventing memory loss.  Stop chewing gum or change brands as I think this may be irritating your mouth. Follow up with your dentist if you are still having issues.   If you do not hear anything about your referral in the next 1-2 weeks, call our office and ask for an update.  Let us know if you need anything.

## 2020-05-19 NOTE — Progress Notes (Signed)
Chief Complaint  Patient presents with   Follow-up    ER Room    Subjective: Patient is a 79 y.o. female here for several things.  Has had irritation on the side of her mouth for several months. Notices it more when she chews gum. She chews sugar free gum.   Duration of issue: 6 days  Lower central chest Quality: difficulty to discribe Palliation: 4 asa Provocation: none Severity: 2/10 Radiation: none Duration of chest pain: 5 minutes Associated symptoms: No sob, arm pain, jaw pain Cardiac history: HLD, HTN, no CAD that she is aware of Family heart history: mom Smoker? No  Questions about her colon polyp (20 mm).   Past Medical History:  Diagnosis Date   Arthritis    Chickenpox    History of uterine cancer    Hyperlipidemia    Hypertension    Hypothyroid    Osteopenia     Objective: BP (!) 142/84 (BP Location: Left Arm, Patient Position: Sitting, Cuff Size: Normal)    Pulse (!) 101    Temp 98.4 F (36.9 C) (Oral)    Ht 5\' 4"  (1.626 m)    Wt 188 lb 6 oz (85.4 kg)    SpO2 99%    BMI 32.33 kg/m  General: Awake, appears stated age HEENT: hypopigmented and linear lesions noted on buccal mucosa b/l Heart: RRR, no LE edema, no bruits MSK: CP is not reproducible to palpation Lungs: CTAB, no rales, wheezes or rhonchi. No accessory muscle use Psych: Age appropriate judgment and insight, normal affect and mood  Assessment and Plan: Atypical chest pain - Plan: Ambulatory referral to Cardiology  Lesion of mouth  1. Sounds msk given relief from ASA. Will refer to cards for reassurance. Pt's spouse sees. Dr. Percival Spanish and she requested to see him.  2. Cut back on chewing gum. Consider changing brands. Toothpaste brand changing as helped. Will f/u w dentistry team if no better. F/u as originally scheduled.  The patient voiced understanding and agreement to the plan.  Lehr, DO 05/19/20  2:01 PM

## 2020-05-19 NOTE — Telephone Encounter (Signed)
Called and updated list/removed one of the calciums. Clarified list for her.

## 2020-06-02 MED ORDER — LOSARTAN POTASSIUM-HCTZ 100-25 MG PO TABS: 1.0000 | ORAL_TABLET | Freq: Every day | ORAL | 3 refills | Status: AC

## 2020-06-05 ENCOUNTER — Other Ambulatory Visit: Payer: Self-pay | Admitting: Family Medicine

## 2020-06-05 DIAGNOSIS — Z1231 Encounter for screening mammogram for malignant neoplasm of breast: Secondary | ICD-10-CM

## 2020-06-18 ENCOUNTER — Ambulatory Visit: Payer: Self-pay

## 2020-06-18 ENCOUNTER — Ambulatory Visit (INDEPENDENT_AMBULATORY_CARE_PROVIDER_SITE_OTHER): Payer: Medicare Other | Admitting: Orthopedic Surgery

## 2020-06-18 DIAGNOSIS — M170 Bilateral primary osteoarthritis of knee: Secondary | ICD-10-CM

## 2020-06-18 DIAGNOSIS — M25562 Pain in left knee: Secondary | ICD-10-CM

## 2020-06-20 DIAGNOSIS — M79671 Pain in right foot: Secondary | ICD-10-CM | POA: Diagnosis not present

## 2020-06-20 DIAGNOSIS — M79672 Pain in left foot: Secondary | ICD-10-CM | POA: Diagnosis not present

## 2020-06-20 DIAGNOSIS — B351 Tinea unguium: Secondary | ICD-10-CM | POA: Diagnosis not present

## 2020-06-22 ENCOUNTER — Encounter: Payer: Self-pay | Admitting: Orthopedic Surgery

## 2020-06-22 NOTE — Progress Notes (Signed)
Office Visit Note   Patient: Jacqueline Smith           Date of Birth: 15-Sep-1941           MRN: [id number] Visit Date: 06/18/2020 Requested by: Shelda Pal, [address] [place],  [place] [address] PCP: Shelda Pal, DO  Subjective: Chief Complaint  Patient presents with  . Right Knee - Pain  . Left Knee - Pain    HPI: Jacqueline Smith is a 79 y.o. female who presents to the office complaining of bilateral knee pain.  Patient notes right knee bothers her more than her left knee.  She has years of knee pain.  Pain does not wake her up at night.  She denies any recent injuries.  She does note occasional low back pain but denies any numbness/tingling, radicular pain, groin pain.  She has a history of one right knee surgery where a cyst was removed about 15 years ago.  She does exercises every day for her knee.  She is able to walk 2 miles per day.  Her last injection was 4 months ago.  She notes a history of Baker's cyst with aspiration as well as a cyst in the medial/anterior right knee..                ROS: All systems reviewed are negative as they relate to the chief complaint within the history of present illness.  Patient denies fevers or chills.  Assessment & Plan: Visit Diagnoses:  1. Primary osteoarthritis of knees, bilateral   2. Pain in both knees, unspecified chronicity     Plan: Patient is a 79 year old female presents complaining of bilateral knee pain.  She has a long history of chronic bilateral knee pain.  Radiographs taken today reveal moderate to severe osteoarthritis of the bilateral knees.  She notes primarily knee pain without any radicular pain or groin pain.  She has had injections in the past with good relief.  Last injection was 4 months ago.  She also has a Baker's cyst that is present.  Baker's cyst was aspirated today.  Bilateral knees were injected with cortisone.  Patient tolerated the procedure well.  Follow-up in 5  months for clinical recheck.  Follow-Up Instructions: No follow-ups on file.   Orders:  Orders Placed This Encounter  Procedures  . XR Knee 1-2 Views Right  . XR Knee 1-2 Views Left   No orders of the defined types were placed in this encounter.     Procedures: Large Joint Inj: bilateral knee on 06/23/2020 8:07 AM Indications: diagnostic evaluation, joint swelling and pain Details: 18 G 1.5 in needle, superolateral approach  Arthrogram: No  Medications (Right): 5 mL lidocaine 1 %; 4 mL bupivacaine 0.25 %; 40 mg methylPREDNISolone acetate 40 MG/ML Medications (Left): 5 mL lidocaine 1 %; 4 mL bupivacaine 0.25 %; 40 mg methylPREDNISolone acetate 40 MG/ML Outcome: tolerated well, no immediate complications Procedure, treatment alternatives, risks and benefits explained, specific risks discussed. Consent was given by the patient. Immediately prior to procedure a time out was called to verify the correct patient, procedure, equipment, support staff and site/side marked as required. Patient was prepped and draped in the usual sterile fashion.       Clinical Data: No additional findings.  Objective: Vital Signs: There were no vitals taken for this visit.  Physical Exam:  Constitutional: Patient appears well-developed HEENT:  Head: Normocephalic Eyes:EOM are normal Neck: Normal range of  motion Cardiovascular: Normal rate Pulmonary/chest: Effort normal Neurologic: Patient is alert Skin: Skin is warm Psychiatric: Patient has normal mood and affect  Ortho Exam: Ortho exam demonstrates bilateral knees with intact extensor no effusion bilaterally.  Baker's cyst present of the right knee.  Small medial/anterior right knee cyst.  Joint line tenderness present over the medial lateral joint lines of both knees.  No pain with hip range of motion bilaterally.  Negative straight leg raise bilaterally.  No ligamentous laxity of either knee.  Specialty Comments:  No specialty comments  available.  Imaging: No results found.   PMFS History: Patient Active Problem List   Diagnosis Date Noted  . History of uterine cancer   . Polyarthralgia 03/04/2020  . Primary osteoarthritis of both knees 03/04/2020  . Fall 09/21/2019  . Facial hematoma 09/21/2019  . Skin tear of forearm without complication, initial encounter 09/21/2019  . Osteopenia   . Skin lesion 11/09/2018  . Eczema 11/09/2018  . Essential hypertension 08/14/2018  . Hyperlipidemia 08/14/2018  . Prediabetes 08/14/2018  . Hypothyroidism 08/14/2018  . Insomnia 08/14/2018   Past Medical History:  Diagnosis Date  . Arthritis   . Chickenpox   . History of uterine cancer   . Hyperlipidemia   . Hypertension   . Hypothyroid   . Osteopenia     Family History  Problem Relation Age of Onset  . Arthritis Mother   . Hyperlipidemia Mother   . Heart attack Mother   . Hypertension Mother   . Myelodysplastic syndrome Mother   . Arthritis Father   . Hypertension Father   . Kidney disease Father   . Cancer Father        Hx of colon cancer  . Single kidney Father   . Parkinson's disease Father   . Colon cancer Father   . Colon polyps Father   . Arthritis Sister   . Rheum arthritis Sister   . Pulmonary Hypertension Sister   . Lupus Sister   . Heart attack Maternal Grandmother   . Diabetes Maternal Grandfather   . Stroke Paternal Grandmother   . Arthritis Sister   . Cerebral aneurysm Sister   . Breast cancer Neg Hx   . Esophageal cancer Neg Hx   . Rectal cancer Neg Hx   . Stomach cancer Neg Hx     Past Surgical History:  Procedure Laterality Date  . ABDOMINAL HYSTERECTOMY  2002  . COLONOSCOPY    . FOOT SURGERY Right   . HERNIA REPAIR    . POLYPECTOMY     Social History   Occupational History  . Not on file  Tobacco Use  . Smoking status: Former Smoker    Quit date: 11/29/2013    Years since quitting: 6.5  . Smokeless tobacco: Never Used  Vaping Use  . Vaping Use: Never used  Substance  and Sexual Activity  . Alcohol use: Yes    Alcohol/week: 20.0 standard drinks    Types: 20 Glasses of wine per week    Comment: several per week  . Drug use: Never  . Sexual activity: Not on file

## 2020-06-23 DIAGNOSIS — M25561 Pain in right knee: Secondary | ICD-10-CM | POA: Diagnosis not present

## 2020-06-23 DIAGNOSIS — M25562 Pain in left knee: Secondary | ICD-10-CM

## 2020-06-23 DIAGNOSIS — M170 Bilateral primary osteoarthritis of knee: Secondary | ICD-10-CM | POA: Diagnosis not present

## 2020-06-23 MED ORDER — LIDOCAINE HCL 1 % IJ SOLN
5.0000 mL | INTRAMUSCULAR | Status: AC | PRN
  Administered 2020-06-23: 5 mL

## 2020-06-23 MED ORDER — BUPIVACAINE HCL 0.25 % IJ SOLN
4.0000 mL | INTRAMUSCULAR | Status: AC | PRN
  Administered 2020-06-23: 4 mL via INTRA_ARTICULAR

## 2020-06-23 MED ORDER — METHYLPREDNISOLONE ACETATE 40 MG/ML IJ SUSP
40.0000 mg | INTRAMUSCULAR | Status: AC | PRN
  Administered 2020-06-23: 40 mg via INTRA_ARTICULAR

## 2020-07-03 DIAGNOSIS — M170 Bilateral primary osteoarthritis of knee: Secondary | ICD-10-CM | POA: Diagnosis not present

## 2020-07-03 DIAGNOSIS — M8949 Other hypertrophic osteoarthropathy, multiple sites: Secondary | ICD-10-CM | POA: Diagnosis not present

## 2020-07-03 DIAGNOSIS — G8929 Other chronic pain: Secondary | ICD-10-CM | POA: Diagnosis not present

## 2020-07-03 DIAGNOSIS — M545 Low back pain: Secondary | ICD-10-CM | POA: Diagnosis not present

## 2020-07-09 ENCOUNTER — Other Ambulatory Visit: Payer: Self-pay

## 2020-07-09 ENCOUNTER — Ambulatory Visit
Admission: RE | Admit: 2020-07-09 | Discharge: 2020-07-09 | Disposition: A | Discharge status: Home / Self Care | Payer: Medicare Other | Source: Ambulatory Visit | Attending: Family Medicine | Admitting: Family Medicine

## 2020-07-09 DIAGNOSIS — Z1231 Encounter for screening mammogram for malignant neoplasm of breast: Secondary | ICD-10-CM | POA: Diagnosis not present

## 2020-07-10 ENCOUNTER — Other Ambulatory Visit: Payer: Medicare Other

## 2020-07-10 DIAGNOSIS — Z20822 Contact with and (suspected) exposure to covid-19: Secondary | ICD-10-CM | POA: Diagnosis not present

## 2020-07-12 LAB — SARS-COV-2, NAA 2 DAY TAT

## 2020-07-12 LAB — NOVEL CORONAVIRUS, NAA: SARS-CoV-2, NAA: NOT DETECTED

## 2020-07-14 DIAGNOSIS — Z23 Encounter for immunization: Secondary | ICD-10-CM | POA: Diagnosis not present

## 2020-07-14 NOTE — Progress Notes (Signed)
Cardiology Office Note   Date:  07/15/2020   ID:  Jacqueline Smith, DOB 29-May-1941, MRN [id number]  PCP:  Shelda Pal, DO  Cardiologist:   Minus Breeding, MD Referring:  Shelda Pal, DO  Chief Complaint  Patient presents with  . Chest Pain      History of Present Illness: Jacqueline Smith is a 79 y.o. female who is referred by Shelda Pal, DO for evaluation of dyspnea and chest pain.  She was in the emergency room in July and I reviewed these records.  She did not wait to be seen.  She went because of chest discomfort.  She has been getting some discomfort that is really in the mid upper epigastric and around under the rib cage on both sides.  It comes and goes.  She seems to notice it when she is seated and she stands up.  However, in July this discomfort usually goes away after few seconds was there for several minutes.  It was very mild in intensity but she thought she needed to be checked out.  In the emergency room a chest x-ray demonstrated no acute disease.  There was some subtle inferolateral ST depression that is not evident today.  Blood work was unremarkable including 1 - troponin.  Of note when she did have the discomfort she called EMS and she did take baby aspirin which she thought helped with her discomfort.  She has had some fleeting discomfort since that time.  She does do a little walking for exercise.  She takes care of her husband who has multiple medical problems and he is also my patient.  She has some stress associated with this.  Past Medical History:  Diagnosis Date  . Arthritis   . Chickenpox   . History of uterine cancer   . Hyperlipidemia   . Hypertension   . Hypothyroid   . Osteopenia     Past Surgical History:  Procedure Laterality Date  . ABDOMINAL HYSTERECTOMY  2002  . COLONOSCOPY    . FOOT SURGERY Right   . HERNIA REPAIR    . POLYPECTOMY       Current Outpatient Medications  Medication Sig Dispense  Refill  . acetaminophen (TYLENOL) 500 MG tablet Take 500 mg by mouth every 6 (six) hours as needed.    . calcium carbonate (OS-CAL) 600 MG TABS tablet Take 600 mg by mouth in the morning and at bedtime.    . Cholecalciferol (VITAMIN D3) 25 MCG (1000 UT) CAPS Take by mouth.    . cyanocobalamin 1000 MCG tablet Take by mouth daily.    Marland Kitchen glucosamine-chondroitin 500-400 MG tablet Take 1 tablet by mouth 3 (three) times daily.    Marland Kitchen losartan-hydrochlorothiazide (HYZAAR) 100-25 MG tablet Take 1 tablet by mouth daily. 90 tablet 3  . Multiple Vitamin (MULTIVITAMIN) tablet Take 1 tablet by mouth daily.    . pravastatin (PRAVACHOL) 20 MG tablet Take 1 tablet (20 mg total) by mouth daily. 90 tablet 3  . SYNTHROID 112 MCG tablet TAKE 1 TABLET BY MOUTH EVERY DAY BEFORE BREAKFAST 90 tablet 0  . temazepam (RESTORIL) 15 MG capsule TAKE 1 CAPSULE BY MOUTH AT BEDTIME AS NEEDED FOR SLEEP. 30 capsule 5  . triamcinolone cream (KENALOG) 0.1 % APPLY TO AFFECTED AREA TWICE A DAY 30 g 0   Current Facility-Administered Medications  Medication Dose Route Frequency Provider Last Rate Last Admin  . 0.9 %  sodium chloride infusion  500 mL Intravenous  Once Cirigliano, [employer], DO        Allergies:   Demerol [meperidine hcl], Iodine, Keflex [cephalexin], Macrobid [nitrofurantoin macrocrystal], Mevacor [lovastatin], Morphine and related, Sulfa antibiotics, Terramycin [oxytetracycline], and Vicodin [hydrocodone-acetaminophen]   Social History:  The patient  reports that she quit smoking about 6 years ago. She has never used smokeless tobacco. She reports current alcohol use of about 20.0 standard drinks of alcohol per week. She reports that she does not use drugs.   Family History:  The patient's family history includes Arthritis in her father, mother, sister, and sister; Cancer in her father; Cerebral aneurysm in her sister; Colon cancer in her father; Colon polyps in her father; Diabetes in her maternal grandfather; Heart attack  in her maternal grandmother; Heart attack (age of onset: 70) in her mother; Hyperlipidemia in her mother; Hypertension in her father and mother; Kidney disease in her father; Lupus in her sister; Myelodysplastic syndrome in her mother; Parkinson's disease in her father; Pulmonary Hypertension in her sister; Rheum arthritis in her sister; Single kidney in her father; Stroke in her paternal grandmother.    ROS:  Please see the history of present illness.   Otherwise, review of systems are positive for none.   All other systems are reviewed and negative.    PHYSICAL EXAM: VS:  BP 130/72   Pulse 85   Ht 5\' 5"  (1.651 m)   Wt 192 lb (87.1 kg)   SpO2 98%   BMI 31.95 kg/m  , BMI Body mass index is 31.95 kg/m. GENERAL:  Well appearing HEENT:  Pupils equal round and reactive, fundi not visualized, oral mucosa unremarkable NECK:  No jugular venous distention, waveform within normal limits, carotid upstroke brisk and symmetric, no bruits, no thyromegaly LYMPHATICS:  No cervical, inguinal adenopathy LUNGS:  Clear to auscultation bilaterally BACK:  No CVA tenderness CHEST:  Unremarkable HEART:  PMI not displaced or sustained,S1 and S2 within normal limits, no S3, no S4, no clicks, no rubs, no murmurs ABD:  Flat, positive bowel sounds normal in frequency in pitch, no bruits, no rebound, no guarding, no midline pulsatile mass, no hepatomegaly, no splenomegaly EXT:  2 plus pulses throughout, no edema, no cyanosis no clubbing SKIN:  No rashes no nodules NEURO:  Cranial nerves II through XII grossly intact, motor grossly intact throughout PSYCH:  Cognitively intact, oriented to person place and time    EKG:  EKG is ordered today. The ekg ordered today demonstrates sinus rhythm, rate 85, axis within normal limits, intervals within normal limits, no acute ST-T wave changes.   Recent Labs: 04/08/2020: ALT 15; TSH 1.74 05/13/2020: BUN 10; Creatinine, Ser 0.65; Hemoglobin 13.7; Platelets 275; Potassium  4.0; Sodium 137    Lipid Panel    Component Value Date/Time   CHOL 211 (H) 04/08/2020 1212   TRIG 101.0 04/08/2020 1212   HDL 76.10 04/08/2020 1212   CHOLHDL 3 04/08/2020 1212   VLDL 20.2 04/08/2020 1212   LDLCALC 115 (H) 04/08/2020 1212      Wt Readings from Last 3 Encounters:  07/15/20 192 lb (87.1 kg)  05/19/20 188 lb 6 oz (85.4 kg)  05/13/20 190 lb (86.2 kg)      Other studies Reviewed: Additional studies/ records that were reviewed today include: ED records. Review of the above records demonstrates:  Please see elsewhere in the note.     ASSESSMENT AND PLAN:  CHEST PAIN:   Chest pain is atypical but she does have risk factors.  There were some  subtle ST changes on the EKG in the emergency room.  At this point I would like to screen her with a POET (Plain Old Exercise Treadmill)  HTN:    Her blood pressure is at target.  No change in therapy.  DYSLIPIDEMIA: Her LDL is not quite at target.  She is on very low-dose statin and I am getting increase this pravastatin 20 mg.  COVID EDUCATION: She has had her vaccine.  We talked about the booster shot.  Current medicines are reviewed at length with the patient today.  The patient does not have concerns regarding medicines.  The following changes have been made:  no change  Labs/ tests ordered today include:   Orders Placed This Encounter  Procedures  . EXERCISE TOLERANCE TEST (ETT)  . EKG 12-Lead     Disposition:   FU with me as needed.   Signed, Minus Breeding, MD  07/15/2020 4:21 PM    [facility]

## 2020-07-15 ENCOUNTER — Encounter: Payer: Self-pay | Admitting: Cardiology

## 2020-07-15 ENCOUNTER — Other Ambulatory Visit: Payer: Self-pay

## 2020-07-15 ENCOUNTER — Ambulatory Visit (INDEPENDENT_AMBULATORY_CARE_PROVIDER_SITE_OTHER): Payer: Medicare Other | Admitting: Cardiology

## 2020-07-15 VITALS — BP 130/72 | HR 85 | Ht 65.0 in | Wt 192.0 lb

## 2020-07-15 DIAGNOSIS — Z7189 Other specified counseling: Secondary | ICD-10-CM

## 2020-07-15 DIAGNOSIS — E785 Hyperlipidemia, unspecified: Secondary | ICD-10-CM | POA: Diagnosis not present

## 2020-07-15 DIAGNOSIS — R0602 Shortness of breath: Secondary | ICD-10-CM | POA: Diagnosis not present

## 2020-07-15 DIAGNOSIS — R079 Chest pain, unspecified: Secondary | ICD-10-CM

## 2020-07-15 MED ORDER — PRAVASTATIN SODIUM 20 MG PO TABS: 20.0000 mg | ORAL_TABLET | Freq: Every day | ORAL | 3 refills | Status: AC

## 2020-07-15 NOTE — Patient Instructions (Addendum)
Medication Instructions:   INCREASE Pravastatin to 20 mg daily  *If you need a refill on your cardiac medications before your next appointment, please call your pharmacy*  Lab Work: You will need to have a COVID-19 test. This test is done at [address], [place], [place] [address] If you have labs (blood work) drawn today and your tests are completely normal, you will receive your results only by: Marland Kitchen MyChart Message (if you have MyChart) OR . A paper copy in the mail If you have any lab test that is abnormal or we need to change your treatment, we will call you to review the results.  Testing/Procedures: Your physician has requested that you have an exercise tolerance test. For further information please visit [email or web address]. Please also follow instruction sheet, as given.   Please schedule for next available appointment    Follow-Up: At [facility], you and your health needs are our priority.  As part of our continuing mission to provide you with exceptional heart care, we have created designated Provider Care Teams.  These Care Teams include your primary Cardiologist (physician) and Advanced Practice Providers (APPs -  Physician Assistants and Nurse Practitioners) who all work together to provide you with the care you need, when you need it.    Your next appointment:   As needed    The format for your next appointment:   In Person  Provider:   You may see Minus Breeding, MD or one of the following Advanced Practice Providers on your designated Care Team:    Rosaria Ferries, PA-C  Jory Sims, DNP, ANP  Other Instructions

## 2020-07-23 ENCOUNTER — Other Ambulatory Visit: Payer: Medicare Other

## 2020-07-23 ENCOUNTER — Inpatient Hospital Stay (HOSPITAL_COMMUNITY)
Admission: RE | Admit: 2020-07-23 | Discharge: 2020-07-23 | Disposition: A | Discharge status: Home / Self Care | Payer: Medicare Other | Source: Ambulatory Visit

## 2020-07-23 ENCOUNTER — Other Ambulatory Visit: Payer: Self-pay

## 2020-07-23 DIAGNOSIS — Z20822 Contact with and (suspected) exposure to covid-19: Secondary | ICD-10-CM

## 2020-07-23 NOTE — Progress Notes (Signed)
Patient presented to the COVID drive thru testing site for a pre-procedure COVID test.  Nurse Tech approached patients car and asked for patients name and birthday.  Patient asked about the testing platform that we use.  Patient asked if we just swabbed the tip of the nose.  Tech explained to the patient that we used the nasopharynal swab and that we go back deeper in the nose than just the tip swab.  The Tech stated that the patient then began to use fowl language towards the Tech about the need to go deeper in the nose.  Tech  Then proceeded to go over quarantine instructions and the patient again using fowl language stated she did not understand why we couldn't use the tip swab and that it is stupid to quarantine.  Patient then stated she "was not getting this fucking test and I will call my doctor and tell them".  Patient left the testing site

## 2020-07-24 ENCOUNTER — Telehealth (HOSPITAL_COMMUNITY): Payer: Self-pay | Admitting: *Deleted

## 2020-07-24 LAB — SARS-COV-2, NAA 2 DAY TAT

## 2020-07-24 LAB — NOVEL CORONAVIRUS, NAA: SARS-CoV-2, NAA: NOT DETECTED

## 2020-07-24 NOTE — Telephone Encounter (Signed)
Close encounter 

## 2020-07-25 ENCOUNTER — Other Ambulatory Visit: Payer: Self-pay

## 2020-07-25 ENCOUNTER — Ambulatory Visit (HOSPITAL_COMMUNITY)
Admission: RE | Admit: 2020-07-25 | Discharge: 2020-07-25 | Disposition: A | Discharge status: Home / Self Care | Payer: Medicare Other | Source: Ambulatory Visit | Attending: Cardiovascular Disease | Admitting: Cardiovascular Disease

## 2020-07-25 DIAGNOSIS — R0602 Shortness of breath: Secondary | ICD-10-CM | POA: Insufficient documentation

## 2020-07-25 DIAGNOSIS — R079 Chest pain, unspecified: Secondary | ICD-10-CM | POA: Insufficient documentation

## 2020-07-25 LAB — EXERCISE TOLERANCE TEST
Estimated workload: 7 METS
Exercise duration (min): 5 min
Exercise duration (sec): 3 s
MPHR: 140 {beats}/min
Peak HR: 137 {beats}/min
Percent HR: 97 %
Rest HR: 76 {beats}/min

## 2020-08-11 ENCOUNTER — Encounter: Payer: Self-pay | Admitting: Family Medicine

## 2020-08-11 ENCOUNTER — Ambulatory Visit (INDEPENDENT_AMBULATORY_CARE_PROVIDER_SITE_OTHER): Payer: Medicare Other | Admitting: Family Medicine

## 2020-08-11 ENCOUNTER — Other Ambulatory Visit: Payer: Self-pay

## 2020-08-11 VITALS — BP 122/80 | HR 53 | Temp 97.9°F | Ht 64.0 in | Wt 190.0 lb

## 2020-08-11 DIAGNOSIS — L900 Lichen sclerosus et atrophicus: Secondary | ICD-10-CM | POA: Diagnosis not present

## 2020-08-11 MED ORDER — TRIAMCINOLONE ACETONIDE 0.1 % MT PSTE: 1.0000 "application " | PASTE | Freq: Four times a day (QID) | OROMUCOSAL | 0 refills | Status: AC

## 2020-08-11 NOTE — Patient Instructions (Signed)
Let me know if you need more dental paste and I can send into a [facility].  Good luck in the future. Please let us know if you need anything.

## 2020-08-11 NOTE — Progress Notes (Signed)
Chief Complaint  Patient presents with  . Mouth Lesions    Jacqueline Smith is a 79 y.o. female here for mouth lesions.  Patient has had white mouth lesions she is following up for.  She saw her dentist who prescribed her triamcinolone 0.1% paste.  This does seem to work though she would quickly run out and unless her mouth is relatively dry, it would crumble and follow a.  She is not having any pain, itching, or drainage.  She is moving back to [place] in the next month.  She had questions about her family history and possible history of any autoimmune conditions.  Past Medical History:  Diagnosis Date  . Arthritis   . Chickenpox   . History of uterine cancer   . Hyperlipidemia   . Hypertension   . Hypothyroid   . Osteopenia     BP 122/80 (BP Location: Left Arm, Patient Position: Sitting, Cuff Size: Normal)   Pulse (!) 53   Temp 97.9 F (36.6 C) (Oral)   Ht 5\' 4"  (1.626 m)   Wt 190 lb (86.2 kg)   SpO2 95%   BMI 32.61 kg/m  Gen: awake, alert, appearing stated age Lungs: No accessory muscle use HEENT: MMM, white patches along the buccal mucosa bilaterally without erythema, drainage, or ecchymosis Skin: No external skin lesions noted. No drainage, erythema, TTP, fluctuance, excoriation Psych: Age appropriate judgment and insight  Lichen sclerosus - Plan: triamcinolone (KENALOG) 0.1 % paste  Orders as above should she need prior to establishing in [place].  She had various questions about famhx, autoimmune disease and discussion of a recent cardiac workup.  F/u prn. The patient voiced understanding and agreement to the plan.  Greater than 30 minutes were spent face to face with the patient, coordinating care (for her imminent move), and reviewing her chart.   Wilder, DO 08/11/20 11:47 AM

## 2020-08-19 ENCOUNTER — Other Ambulatory Visit: Payer: Self-pay | Admitting: Family Medicine

## 2020-08-19 DIAGNOSIS — F5102 Adjustment insomnia: Secondary | ICD-10-CM

## 2020-08-19 NOTE — Telephone Encounter (Signed)
Requesting: temazepam 15mg  Contract: None UDS: None Last Visit: 08/11/2020 Next Visit: None scheduled Last Refill: 02/11/2020 #30 and 5RF   Please Advise

## 2020-08-29 DIAGNOSIS — M79671 Pain in right foot: Secondary | ICD-10-CM | POA: Diagnosis not present

## 2020-08-29 DIAGNOSIS — M79672 Pain in left foot: Secondary | ICD-10-CM | POA: Diagnosis not present

## 2020-08-29 DIAGNOSIS — B351 Tinea unguium: Secondary | ICD-10-CM | POA: Diagnosis not present

## 2020-10-06 DIAGNOSIS — M170 Bilateral primary osteoarthritis of knee: Secondary | ICD-10-CM | POA: Diagnosis not present

## 2020-10-06 DIAGNOSIS — Z23 Encounter for immunization: Secondary | ICD-10-CM | POA: Diagnosis not present

## 2020-10-06 DIAGNOSIS — E785 Hyperlipidemia, unspecified: Secondary | ICD-10-CM | POA: Diagnosis not present

## 2020-10-06 DIAGNOSIS — I10 Essential (primary) hypertension: Secondary | ICD-10-CM | POA: Diagnosis not present

## 2020-10-06 DIAGNOSIS — E039 Hypothyroidism, unspecified: Secondary | ICD-10-CM | POA: Diagnosis not present

## 2020-10-06 DIAGNOSIS — R7303 Prediabetes: Secondary | ICD-10-CM | POA: Diagnosis not present

## 2020-10-06 DIAGNOSIS — M25561 Pain in right knee: Secondary | ICD-10-CM | POA: Diagnosis not present

## 2020-10-06 DIAGNOSIS — Z0000 Encounter for general adult medical examination without abnormal findings: Secondary | ICD-10-CM | POA: Diagnosis not present

## 2020-10-06 DIAGNOSIS — D369 Benign neoplasm, unspecified site: Secondary | ICD-10-CM | POA: Diagnosis not present

## 2020-10-06 DIAGNOSIS — M25562 Pain in left knee: Secondary | ICD-10-CM | POA: Diagnosis not present

## 2020-10-06 DIAGNOSIS — I83813 Varicose veins of bilateral lower extremities with pain: Secondary | ICD-10-CM | POA: Diagnosis not present

## 2020-10-14 ENCOUNTER — Ambulatory Visit: Payer: Medicare Other | Admitting: Family Medicine

## 2021-08-01 IMAGING — MG DIGITAL SCREENING BILAT W/ TOMO W/ CAD
8 series · 8 of 24 positions shown · non-contrast
Comparison: Previous exam(s).

CLINICAL DATA: Screening.

EXAM:
DIGITAL SCREENING BILATERAL MAMMOGRAM WITH TOMO AND CAD

[L MLO synth-2D]
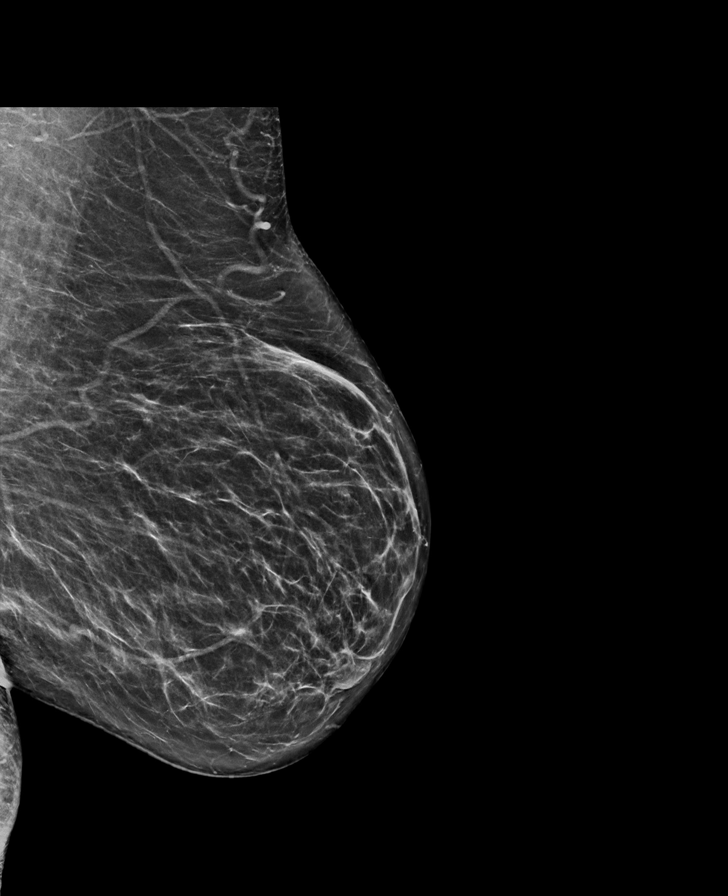

[R CC synth-2D]
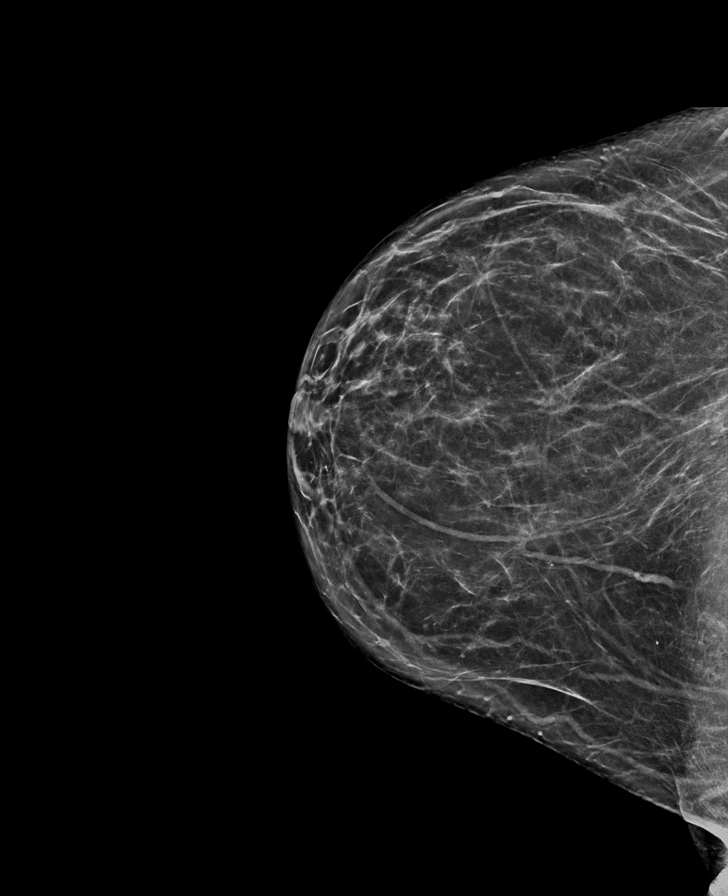

[L CC synth-2D]
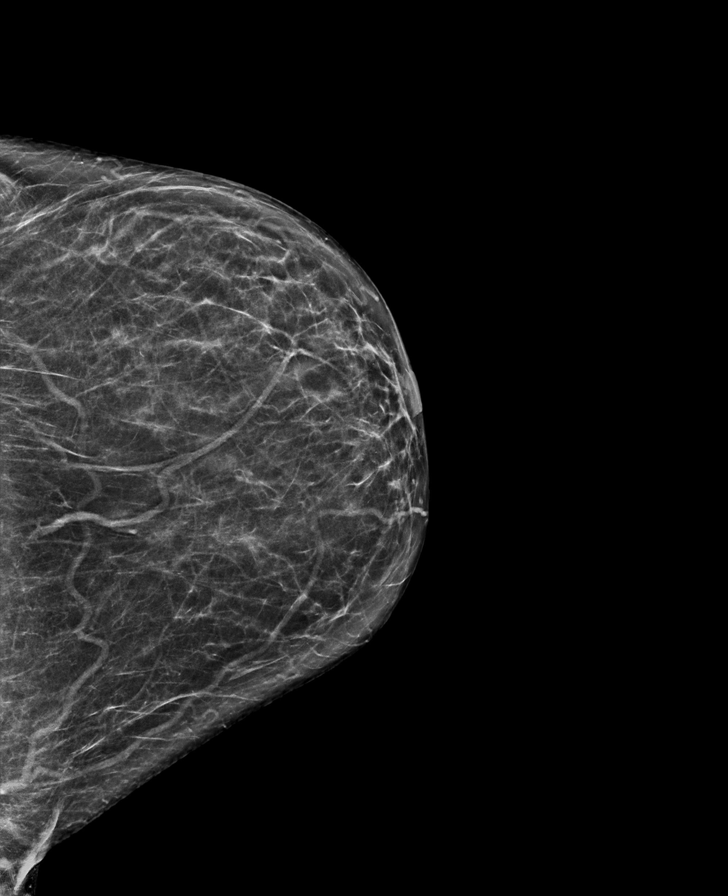

[R MLO synth-2D]
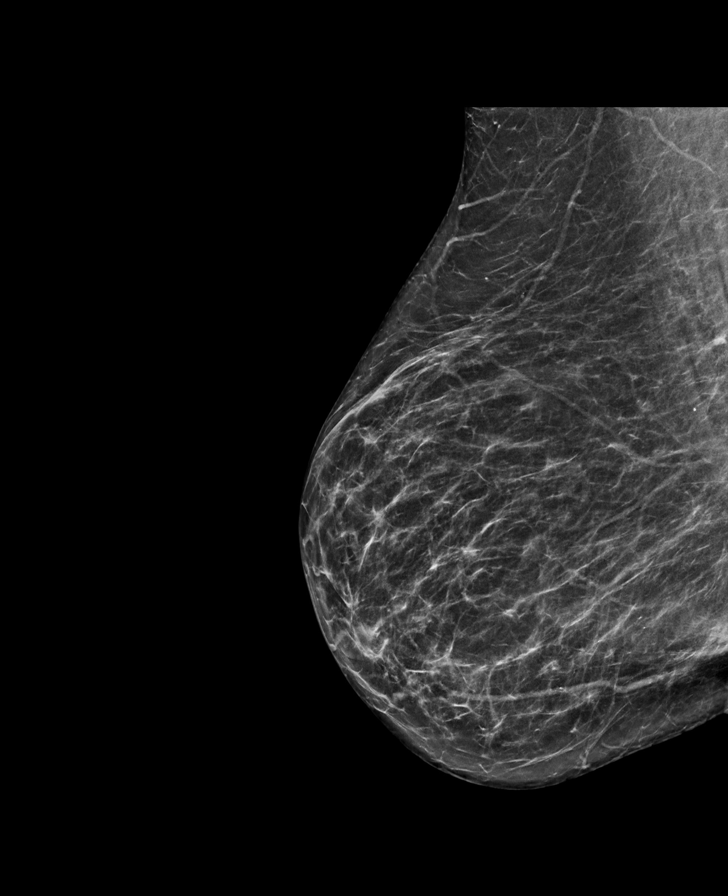

[R MLO tomo · tomo slice 34/67.0]
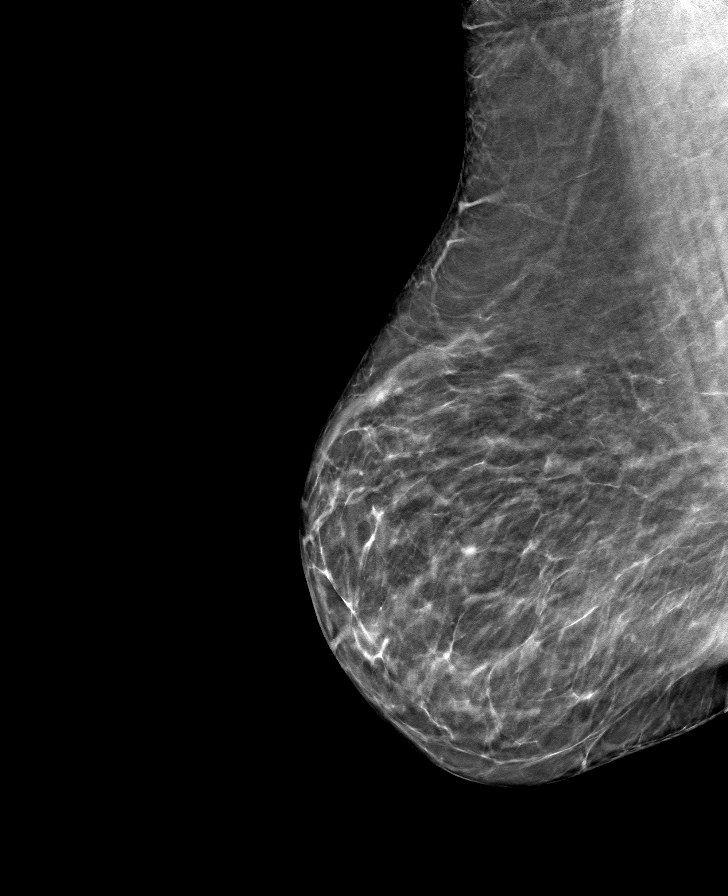

[L CC tomo · tomo slice 31/61.0]
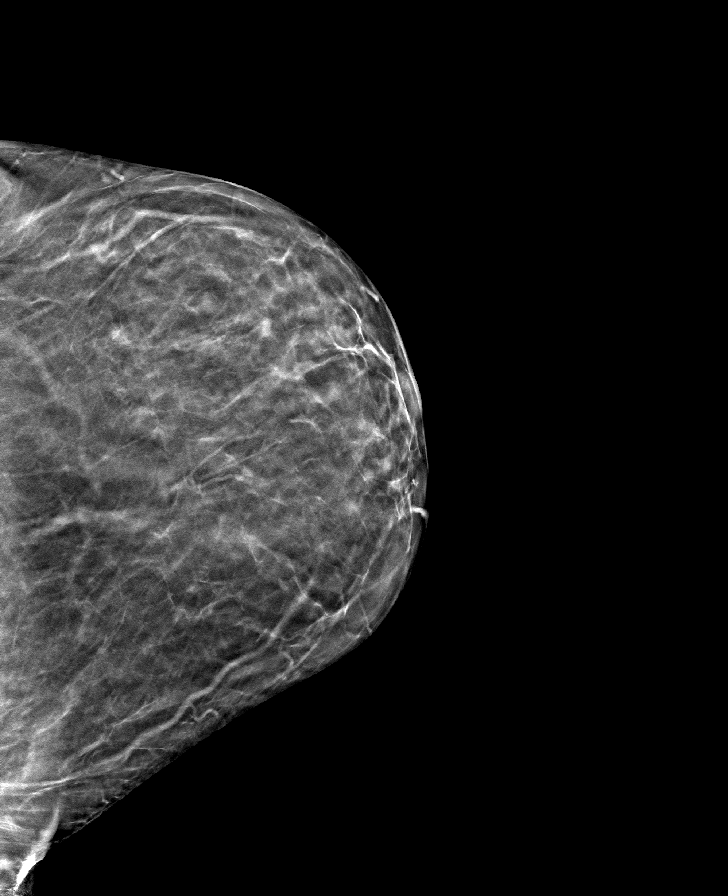

[L MLO tomo · tomo slice 35/69.0]
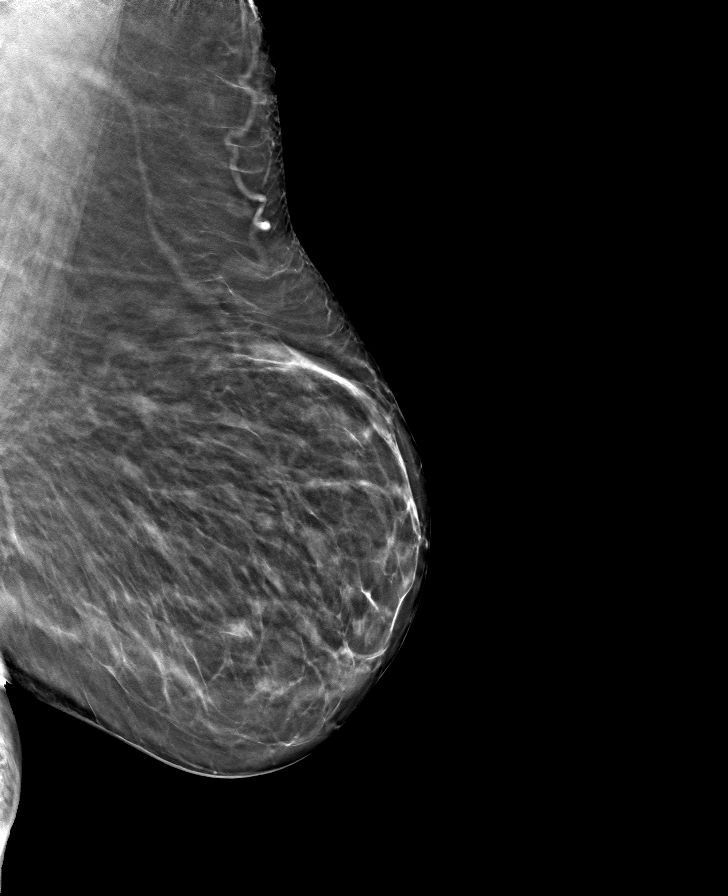

[R CC tomo · tomo slice 33/66.0]
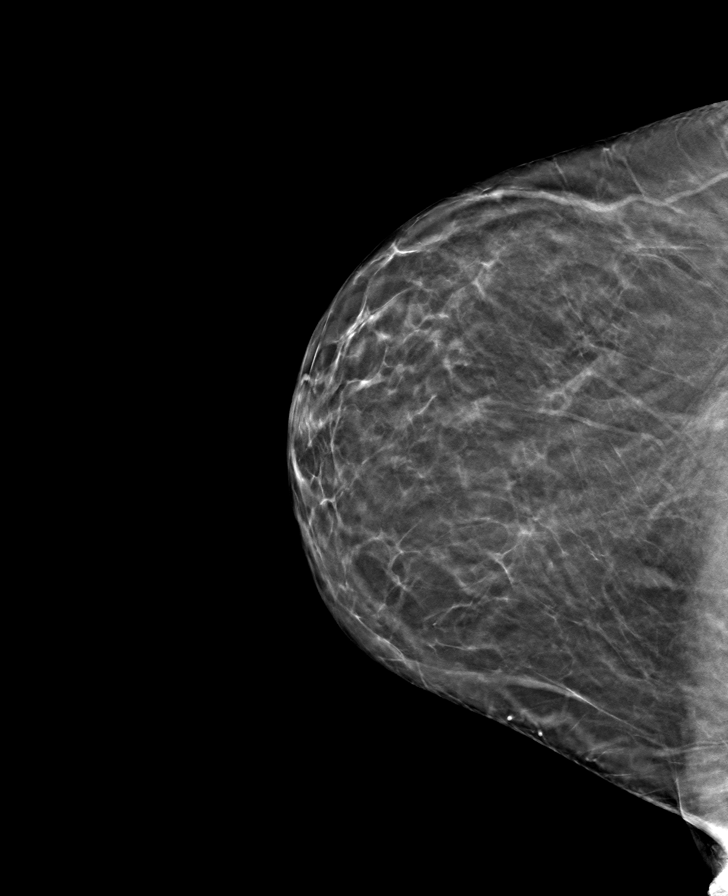

[8 of 24 positions shown; findings below may reference images not displayed]

ACR Breast Density Category b: There are scattered areas of
fibroglandular density.
FINDINGS: There are no findings suspicious for malignancy. Images were
processed with CAD.
IMPRESSION: No mammographic evidence of malignancy. A result letter of this
screening mammogram will be mailed directly to the patient.

RECOMMENDATION:
Screening mammogram in one year. (Code:[id number])

BI-RADS CATEGORY  1: Negative.

## 2022-06-25 ENCOUNTER — Encounter: Payer: Self-pay | Admitting: Family Medicine
